# Patient Record
Sex: Female | Born: 1985 | Hispanic: No | Marital: Single | Smoking: Never smoker
Health system: Southern US, Community
[De-identification: ages and names within clinical notes are randomized; demographics above are authoritative.]

## PROBLEM LIST (undated history)

## (undated) DIAGNOSIS — J45909 Unspecified asthma, uncomplicated: Secondary | ICD-10-CM

---
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
## 2012-10-23 ENCOUNTER — Ambulatory Visit: Payer: Self-pay | Admitting: Primary Care

## 2013-03-27 ENCOUNTER — Inpatient Hospital Stay: Payer: Self-pay

## 2013-03-27 LAB — CBC WITH DIFFERENTIAL/PLATELET
Basophil #: 0 10*3/uL (ref 0.0–0.1)
Basophil %: 0.3 %
HCT: 35.8 % (ref 35.0–47.0)
Lymphocyte #: 1.3 10*3/uL (ref 1.0–3.6)
Lymphocyte %: 13.6 %
MCV: 85 fL (ref 80–100)
Monocyte #: 0.6 x10 3/mm (ref 0.2–0.9)
Neutrophil %: 79.6 %
RDW: 15.5 % — ABNORMAL HIGH (ref 11.5–14.5)
WBC: 9.3 10*3/uL (ref 3.6–11.0)

## 2013-03-29 LAB — HEMATOCRIT: HCT: 30.3 % — ABNORMAL LOW (ref 35.0–47.0)

## 2015-03-26 NOTE — Op Note (Signed)
PATIENT NAME:  Morgan Hawkins, Morgan Hawkins MR#:  [id number] DATE OF BIRTH:  1985/12/10  DATE OF PROCEDURE:  03/28/2013  PREOPERATIVE DIAGNOSES:  1. Term gestation.  2. Labor.  3. Cephalopelvic disproportion.   POSTOPERATIVE DIAGNOSES:  1. Term gestation.  2. Labor.  3. Cephalopelvic disproportion.   PROCEDURES: Primary low-transverse cesarean section.   SURGEON: Ricky L. Logan BoresEvans, MD   ASSISTANT: Scrub tech, Morrie SheldonAshley.   ANESTHESIA: Spinal by Dr. Maisie Fushomas.   FINDINGS: No descent over at least 6 hours of labor. Vigorous female infant weighing 3420 grams, 7 pounds 10 ounces. Apgars 9 and 9. Grossly normal uterus, tubes and ovaries.   DRAINS: Foley.   COMPLICATIONS: Midline vertical extension in the lower uterine segment which was recognized and repaired.   ESTIMATED BLOOD LOSS: 900.  INTRAVENOUS FLUIDS: 1000.   SPECIMENS: Infant.   PROCEDURE IN DETAIL: The patient was admitted yesterday morning at 1 cm and thick for postdates induction, progressed up to 3.5 cm by early morning and then approximately 6 this morning. Slow progression up to 9+ cm. She was therefore approximately 4 hours with infant still being asynclitic and out of the pelvis, despite [facility]. At this point, cephalopelvic disproportion was diagnosed and discussed with patient and family. Recommended cesarean section, and they agreed. Consent was signed. She was taken to the operating room, where spinal was placed. She was placed in the supine position, prepped and draped in the usual sterile fashion. Foley had been inserted previously. She had bloody urine for the last several hours, felt most likely due to the head pressing against the bladder. The patient was prepped and draped in the usual sterile fashion. After assuring adequate anesthesia, a #10 blade was used to create a Pfannenstiel incision. Bladder that appeared to have been poorly draining was encountered. Bladder flap was created. The bladder  blade was placed. A transverse incision was made in the uterus. Hand of operator was used to wedge the infant's head out of the pelvis, and with fundal pressure, infant was delivered. The mouth and nose were bulb suctioned and handed off to pediatrics after clamping and cutting cord. Placenta was manually delivered. The uterus was exteriorized and immediately recognized a vertical cervical extension. The apices were grasped with ring forceps, and it was closed with a running interlocking 0 chromic. Uterine incision was then closed left to right with running interlocking 0 chromic. There was a hematoma forming at the left apex, and an O'Leary stitch was placed with 0 chromic. This was observed for 2 solid minutes without evidence of expansion. The uterus was returned to the abdominal cavity and copiously irrigated. Small serosal oozers were made hemostatic. The rectus muscle was hemostatic. The fascia was closed left to right with 0 Vicryl. Subcutaneous was made hemostatic with cautery, and the skin was closed with surgical clips.   The patient tolerated the procedure well. I anticipate a routine postoperative course. We will watch her urine. I expect it to clear over the next 24 to 48 hours, and I am not suspicious of bladder injury.   ____________________________ Reatha Harpsicky L. Logan BoresEvans, MD rle:OSi D: 03/28/2013 16:38:55 ET T: 03/29/2013 05:52:39 ET JOB#: [id number]  cc: Clide Clifficky L. Logan BoresEvans, MD, <Dictator> Augustina MoodICK L Oneda Duffett MD ELECTRONICALLY SIGNED 04/01/2013 9:53

## 2015-04-13 NOTE — H&P (Signed)
L&D Evaluation:  History:  HPI 16XW [age] [facility] with LMP of 06/12/12 & EDd of 03/19/13 & US done on 10/23/12 at 18 1/7 with intact membranes and arrived here for IOL due to post-dates. AFI today is 8.27 and NST reactive. Pt was told she was to be induced today however, it was scheduled originally for 03/05/13 but, [facility] called and moved it up due to her dates being 141 1/7 today. No VB, UC's or decreased FM. Pt does not want to go home today so we will proceed with IOL. Dr. Feliberto GottronSchermerhorn aware and ok with Pitocin IOL and AROM when able.   Presents with IOL for post-dates   Patient's Medical History warts on hand   Patient's Surgical History none   Medications Pre Natal Vitamins   Allergies NKDA   Social History none   Family History Non-Contributory   ROS:  ROS All systems were reviewed.  HEENT, CNS, GI, GU, Respiratory, CV, Renal and Musculoskeletal systems were found to be normal.   Exam:  Vital Signs stable   General no apparent distress   Mental Status clear   Chest clear   Heart normal sinus rhythm, no murmur/gallop/rubs   Abdomen gravid, non-tender   Estimated Fetal Weight Average for gestational age   Fetal Position vtx   Back no CVAT   Reflexes 1+   Pelvic 1-2/40/vtx-2   Mebranes Intact   FHT normal rate with no decels   Ucx absent   Skin dry   Lymph no lymphadenopathy   Plan:  Plan monitor contractions and for cervical change   Electronic Signatures: Sharee PimpleJones, Caron W (CNM)  (Signed 24-Apr-14 10:41)  Authored: L&D Evaluation   Last Updated: 24-Apr-14 10:41 by Sharee PimpleJones, Caron W (CNM)

## 2016-06-03 ENCOUNTER — Emergency Department
Admission: EM | Admit: 2016-06-03 | Discharge: 2016-06-03 | Disposition: A | Discharge status: Home / Self Care | Payer: Medicaid Other | Attending: Emergency Medicine | Admitting: Emergency Medicine

## 2016-06-03 ENCOUNTER — Encounter: Payer: Self-pay | Admitting: Emergency Medicine

## 2016-06-03 DIAGNOSIS — J45909 Unspecified asthma, uncomplicated: Secondary | ICD-10-CM | POA: Diagnosis not present

## 2016-06-03 DIAGNOSIS — H6592 Unspecified nonsuppurative otitis media, left ear: Secondary | ICD-10-CM | POA: Insufficient documentation

## 2016-06-03 DIAGNOSIS — H65112 Acute and subacute allergic otitis media (mucoid) (sanguinous) (serous), left ear: Secondary | ICD-10-CM

## 2016-06-03 DIAGNOSIS — B9689 Other specified bacterial agents as the cause of diseases classified elsewhere: Secondary | ICD-10-CM

## 2016-06-03 DIAGNOSIS — J019 Acute sinusitis, unspecified: Secondary | ICD-10-CM | POA: Insufficient documentation

## 2016-06-03 DIAGNOSIS — R0981 Nasal congestion: Secondary | ICD-10-CM | POA: Diagnosis present

## 2016-06-03 HISTORY — DX: Unspecified asthma, uncomplicated: J45.909

## 2016-06-03 MED ORDER — FLUTICASONE PROPIONATE 50 MCG/ACT NA SUSP: 1.0000 | Freq: Two times a day (BID) | NASAL | Status: DC

## 2016-06-03 MED ORDER — AMOXICILLIN-POT CLAVULANATE 875-125 MG PO TABS: 1.0000 | ORAL_TABLET | Freq: Two times a day (BID) | ORAL | Status: DC

## 2016-06-03 MED ORDER — CETIRIZINE HCL 10 MG PO TABS
10.0000 mg | ORAL_TABLET | Freq: Every day | ORAL | Status: DC
Start: 2016-06-03 — End: 2019-12-29

## 2016-06-03 NOTE — ED Notes (Addendum)
Since Monday night. Dry cough, fever 100.8, Pt reports blood tinged and white mucus from nose and left ear pain. Also reports headache and weakness.

## 2016-06-03 NOTE — ED Provider Notes (Signed)
[facility] Emergency Department Provider Note  ____________________________________________  Time seen: Approximately 4:30 PM  I have reviewed the triage vital signs and the nursing notes.   HISTORY  Chief Complaint URI    HPI Morgan Hawkins is a 30 y.o. female who presents emergency department complaining of a week's history of nasal congestion, scratchy throat, intermittent dry cough. Patient states that initially she felt like she had a "cold. She states that symptoms started to improve and then worsened. Patient reports pressure to the maxillary sinus region. She also reports sharp left sided ear pain. Today patient reports that she has a generalized mild headache, feeling "very tired", and seen some mild blood-tinged mucus while blowing her nose. Patient denies any vision changes, difficulty breathing or swallowing, chest pain, shortness of breath, abdominal pain, nausea or vomiting. Patient reports a low-grade fever up to 100.4F. Patient is been using multiple over-the-counter medications including cough syrup, Mucinex, Tylenol.   Past Medical History  Diagnosis Date  . Asthma     There are no active problems to display for this patient.   Past Surgical History  Procedure Laterality Date  . Cesarean section  2014    Current Outpatient Rx  Name  Route  Sig  Dispense  Refill  . amoxicillin-clavulanate (AUGMENTIN) 875-125 MG tablet   Oral   Take 1 tablet by mouth 2 (two) times daily.   14 tablet   0   . cetirizine (ZYRTEC) 10 MG tablet   Oral   Take 1 tablet (10 mg total) by mouth daily.   30 tablet   0   . fluticasone (FLONASE) 50 MCG/ACT nasal spray   Each Nare   Place 1 spray into both nostrils 2 (two) times daily.   16 g   0     Allergies Review of patient's allergies indicates no known allergies.  No family history on file.  Social History Social History  Substance Use Topics  . Smoking status: Never Smoker   .  Smokeless tobacco: Never Used  . Alcohol Use: No     Review of Systems  Constitutional: No fever/chills Eyes: No visual changes. No discharge ENT: Positive for nasal congestion and sinus pressure. Positive for left-sided ear pain. Positive for blood-tinged nasal mucus. Positive for scratchy throat. Cardiovascular: no chest pain. Respiratory: Positive for intermittent nonproductive cough. No SOB. Gastrointestinal: No abdominal pain.  No nausea, no vomiting.   Musculoskeletal: Negative for musculoskeletal pain. Skin: Negative for rash, abrasions, lacerations, ecchymosis. Neurological: Positive for mild headache but denies focal weakness or numbness. 10-point ROS otherwise negative.  ____________________________________________   PHYSICAL EXAM:  VITAL SIGNS: ED Triage Vitals  Enc Vitals Group     BP 06/03/16 1559 125/79 mmHg     Pulse Rate 06/03/16 1559 107     Resp 06/03/16 1559 20     Temp 06/03/16 1559 99.4 F (37.4 C)     Temp Source 06/03/16 1559 Oral     SpO2 06/03/16 1559 96 %     Weight 06/03/16 1559 160 lb (72.576 kg)     Height 06/03/16 1559 5' (1.524 m)     Head Cir --      Peak Flow --      Pain Score 06/03/16 1602 8     Pain Loc --      Pain Edu? --      Excl. in GC? --      Constitutional: Alert and oriented. Well appearing and in no  acute distress. Eyes: Conjunctivae are normal. PERRL. EOMI. Head: Atraumatic. ENT:      Ears: EACs unremarkable bilaterally. TM on right is unremarkable. TM on left is dusky in appearance, bulging, with air-fluid level.      Nose: Moderate, purulent congestion/rhinnorhea. Minimal scabbing noted over the Kiesselbach plexus on left side. Turbinates are erythematous and edematous. Patient is tender to percussion over maxillary sinuses.      Mouth/Throat: Mucous membranes are moist. Oropharynx is mildly erythematous but nonedematous. Uvula is midline. Neck: No stridor. Neck supple with full range of  motion Hematological/Lymphatic/Immunilogical: Diffuse, mobile, nontender anterior cervical lymphadenopathy. Cardiovascular: Normal rate, regular rhythm. Normal S1 and S2.  Good peripheral circulation. Respiratory: Normal respiratory effort without tachypnea or retractions. Lungs CTAB. Good air entry to the bases with no decreased or absent breath sounds. Musculoskeletal: Full range of motion to all extremities. No gross deformities appreciated. Neurologic:  Normal speech and language. No gross focal neurologic deficits are appreciated.  Skin:  Skin is warm, dry and intact. No rash noted. Psychiatric: Mood and affect are normal. Speech and behavior are normal. Patient exhibits appropriate insight and judgement.   ____________________________________________   LABS (all labs ordered are listed, but only abnormal results are displayed)  Labs Reviewed - No data to display ____________________________________________  EKG   ____________________________________________  RADIOLOGY   No results found.  ____________________________________________    PROCEDURES  Procedure(s) performed:       Medications - No data to display   ____________________________________________   INITIAL IMPRESSION / ASSESSMENT AND PLAN / ED COURSE  Pertinent labs & imaging results that were available during my care of the patient were reviewed by me and considered in my medical decision making (see chart for details).  Patient's diagnosis is consistent with bacterial sinusitis and left-sided otitis media. Symptoms are consistent with onset of viral illness started to improve and then worsened into bacterial illness.. Patient will be discharged home with prescriptions for antibiotics for sinusitis and left-sided otitis media, Flonase, Zyrtec. Patient is to continue using cough syrup and Tylenol or Motrin at home.. Patient is to follow up with primary care provider as needed or otherwise directed.  Patient is given ED precautions to return to the ED for any worsening or new symptoms.     ____________________________________________  FINAL CLINICAL IMPRESSION(S) / ED DIAGNOSES  Final diagnoses:  Acute bacterial sinusitis  Acute mucoid otitis media of left ear      NEW MEDICATIONS STARTED DURING THIS VISIT:  New Prescriptions   AMOXICILLIN-CLAVULANATE (AUGMENTIN) 875-125 MG TABLET    Take 1 tablet by mouth 2 (two) times daily.   CETIRIZINE (ZYRTEC) 10 MG TABLET    Take 1 tablet (10 mg total) by mouth daily.   FLUTICASONE (FLONASE) 50 MCG/ACT NASAL SPRAY    Place 1 spray into both nostrils 2 (two) times daily.        This chart was dictated using voice recognition software/Dragon. Despite best efforts to proofread, errors can occur which can change the meaning. Any change was purely unintentional.    Racheal PatchesJonathan D Henderson Frampton, PA-C 06/03/16 1651  Jennye MoccasinBrian S Quigley, MD 06/03/16 [phone]

## 2016-06-03 NOTE — ED Notes (Signed)
Pt discharged to home.  Family member driving.  Discharge instructions reviewed.  Verbalized understanding.  No questions or concerns at this time.  Teach back verified.  Pt in NAD.  No items left in ED.   

## 2016-06-03 NOTE — Discharge Instructions (Signed)
Otitis Media, Adult  Otitis media is redness, soreness, and inflammation of the middle ear. Otitis media may be caused by allergies or, most commonly, by infection. Often it occurs as a complication of the common cold.  SIGNS AND SYMPTOMS  Symptoms of otitis media may include:   Earache.   Fever.   Ringing in your ear.   Headache.   Leakage of fluid from the ear.  DIAGNOSIS  To diagnose otitis media, your health care provider will examine your ear with an otoscope. This is an instrument that allows your health care provider to see into your ear in order to examine your eardrum. Your health care provider also will ask you questions about your symptoms.  TREATMENT   Typically, otitis media resolves on its own within 3-5 days. Your health care provider may prescribe medicine to ease your symptoms of pain. If otitis media does not resolve within 5 days or is recurrent, your health care provider may prescribe antibiotic medicines if he or she suspects that a bacterial infection is the cause.  HOME CARE INSTRUCTIONS    If you were prescribed an antibiotic medicine, finish it all even if you start to feel better.   Take medicines only as directed by your health care provider.   Keep all follow-up visits as directed by your health care provider.  SEEK MEDICAL CARE IF:   You have otitis media only in one ear, or bleeding from your nose, or both.   You notice a lump on your neck.   You are not getting better in 3-5 days.   You feel worse instead of better.  SEEK IMMEDIATE MEDICAL CARE IF:    You have pain that is not controlled with medicine.   You have swelling, redness, or pain around your ear or stiffness in your neck.   You notice that part of your face is paralyzed.   You notice that the bone behind your ear (mastoid) is tender when you touch it.  MAKE SURE YOU:    Understand these instructions.   Will watch your condition.   Will get help right away if you are not doing well or get worse.     This  information is not intended to replace advice given to you by your health care provider. Make sure you discuss any questions you have with your health care provider.     Document Released: 08/25/2004 Document Revised: 12/11/2014 Document Reviewed: 06/17/2013  Elsevier Interactive Patient Education 2016 [employer]  Sinusitis, Adult  Sinusitis is redness, soreness, and inflammation of the paranasal sinuses. Paranasal sinuses are air pockets within the bones of your face. They are located beneath your eyes, in the middle of your forehead, and above your eyes. In healthy paranasal sinuses, mucus is able to drain out, and air is able to circulate through them by way of your nose. However, when your paranasal sinuses are inflamed, mucus and air can become trapped. This can allow bacteria and other germs to grow and cause infection.  Sinusitis can develop quickly and last only a short time (acute) or continue over a long period (chronic). Sinusitis that lasts for more than 12 weeks is considered chronic.  CAUSES  Causes of sinusitis include:   Allergies.   Structural abnormalities, such as displacement of the cartilage that separates your nostrils (deviated septum), which can decrease the air flow through your nose and sinuses and affect sinus drainage.   Functional abnormalities, such as when the small hairs (cilia) that   line your sinuses and help remove mucus do not work properly or are not present.  SIGNS AND SYMPTOMS  Symptoms of acute and chronic sinusitis are the same. The primary symptoms are pain and pressure around the affected sinuses. Other symptoms include:   Upper toothache.   Earache.   Headache.   Bad breath.   Decreased sense of smell and taste.   A cough, which worsens when you are lying flat.   Fatigue.   Fever.   Thick drainage from your nose, which often is green and may contain pus (purulent).   Swelling and warmth over the affected sinuses.  DIAGNOSIS  Your health care provider will  perform a physical exam. During your exam, your health care provider may perform any of the following to help determine if you have acute sinusitis or chronic sinusitis:   Look in your nose for signs of abnormal growths in your nostrils (nasal polyps).   Tap over the affected sinus to check for signs of infection.   View the inside of your sinuses using an imaging device that has a light attached (endoscope).  If your health care provider suspects that you have chronic sinusitis, one or more of the following tests may be recommended:   Allergy tests.   Nasal culture. A sample of mucus is taken from your nose, sent to a lab, and screened for bacteria.   Nasal cytology. A sample of mucus is taken from your nose and examined by your health care provider to determine if your sinusitis is related to an allergy.  TREATMENT  Most cases of acute sinusitis are related to a viral infection and will resolve on their own within 10 days. Sometimes, medicines are prescribed to help relieve symptoms of both acute and chronic sinusitis. These may include pain medicines, decongestants, nasal steroid sprays, or saline sprays.  However, for sinusitis related to a bacterial infection, your health care provider will prescribe antibiotic medicines. These are medicines that will help kill the bacteria causing the infection.  Rarely, sinusitis is caused by a fungal infection. In these cases, your health care provider will prescribe antifungal medicine.  For some cases of chronic sinusitis, surgery is needed. Generally, these are cases in which sinusitis recurs more than 3 times per year, despite other treatments.  HOME CARE INSTRUCTIONS   Drink plenty of water. Water helps thin the mucus so your sinuses can drain more easily.   Use a humidifier.   Inhale steam 3-4 times a day (for example, sit in the bathroom with the shower running).   Apply a warm, moist washcloth to your face 3-4 times a day, or as directed by your health care  provider.   Use saline nasal sprays to help moisten and clean your sinuses.   Take medicines only as directed by your health care provider.   If you were prescribed either an antibiotic or antifungal medicine, finish it all even if you start to feel better.  SEEK IMMEDIATE MEDICAL CARE IF:   You have increasing pain or severe headaches.   You have nausea, vomiting, or drowsiness.   You have swelling around your face.   You have vision problems.   You have a stiff neck.   You have difficulty breathing.     This information is not intended to replace advice given to you by your health care provider. Make sure you discuss any questions you have with your health care provider.     Document Released: 11/20/2005 Document Revised:   12/11/2014 Document Reviewed: 12/05/2011  Elsevier Interactive Patient Education 2016 Elsevier Inc.

## 2019-12-29 ENCOUNTER — Other Ambulatory Visit: Payer: Self-pay

## 2019-12-29 ENCOUNTER — Emergency Department
Admission: EM | Admit: 2019-12-29 | Discharge: 2019-12-29 | Disposition: A | Discharge status: Home / Self Care | Payer: Self-pay | Attending: Emergency Medicine | Admitting: Emergency Medicine

## 2019-12-29 ENCOUNTER — Encounter: Payer: Self-pay | Admitting: Emergency Medicine

## 2019-12-29 DIAGNOSIS — M25511 Pain in right shoulder: Secondary | ICD-10-CM | POA: Insufficient documentation

## 2019-12-29 DIAGNOSIS — J45909 Unspecified asthma, uncomplicated: Secondary | ICD-10-CM | POA: Insufficient documentation

## 2019-12-29 MED ORDER — CYCLOBENZAPRINE HCL 10 MG PO TABS: 10.0000 mg | ORAL_TABLET | Freq: Three times a day (TID) | ORAL | 0 refills | Status: DC | PRN

## 2019-12-29 MED ORDER — LIDOCAINE 5 % EX PTCH
1.0000 | MEDICATED_PATCH | CUTANEOUS | Status: DC
  Administered 2019-12-29: 14:00:00 1 via TRANSDERMAL
  Filled 2019-12-29: qty 1

## 2019-12-29 MED ORDER — TRAMADOL HCL 50 MG PO TABS: 50.0000 mg | ORAL_TABLET | Freq: Four times a day (QID) | ORAL | 0 refills | Status: DC | PRN

## 2019-12-29 MED ORDER — IBUPROFEN 600 MG PO TABS: 600.0000 mg | ORAL_TABLET | Freq: Three times a day (TID) | ORAL | 0 refills | Status: DC | PRN

## 2019-12-29 NOTE — ED Triage Notes (Signed)
Patient presents to the ED with right shoulder pain that started 2 days ago.  Patient describes pain as burning/sharp sensation.  Patient is holding shoulder and reports pain as severe.  Denies any known injury to her shoulder.

## 2019-12-29 NOTE — ED Provider Notes (Signed)
[facility] Emergency Department Provider Note   ____________________________________________   First MD Initiated Contact with Patient 12/29/19 1245     (approximate)  I have reviewed the triage vital signs and the nursing notes.   HISTORY  Chief Complaint Shoulder Pain    HPI Morgan Hawkins is a 34 y.o. female patient complaining 2 days of right superior/posterior shoulder pain.  Patient denies provocative incident for complaint.  Patient stated waking with pain 2 days ago.  Patient denies loss of sensation or movement.  Patient rates pain as a 10/10.  Patient described pain as "burning/sharp".  No palliative measures for complaint.      Past Medical History:  Diagnosis Date  . Asthma     There are no problems to display for this patient.   Past Surgical History:  Procedure Laterality Date  . CESAREAN SECTION  2014    Prior to Admission medications   Medication Sig Start Date End Date Taking? Authorizing Provider  cyclobenzaprine (FLEXERIL) 10 MG tablet Take 1 tablet (10 mg total) by mouth 3 (three) times daily as needed. 12/29/19   Joni Reining, PA-C  ibuprofen (ADVIL) 600 MG tablet Take 1 tablet (600 mg total) by mouth every 8 (eight) hours as needed. 12/29/19   Joni Reining, PA-C  traMADol (ULTRAM) 50 MG tablet Take 1 tablet (50 mg total) by mouth every 6 (six) hours as needed. 12/29/19 12/28/20  Joni Reining, PA-C    Allergies Patient has no known allergies.  No family history on file.  Social History Social History   Tobacco Use  . Smoking status: Never Smoker  . Smokeless tobacco: Never Used  Substance Use Topics  . Alcohol use: No  . Drug use: No    Review of Systems  Constitutional: No fever/chills Eyes: No visual changes. ENT: No sore throat. Cardiovascular: Denies chest pain. Respiratory: Denies shortness of breath. Gastrointestinal: No abdominal pain.  No nausea, no vomiting.  No diarrhea.  No  constipation. Genitourinary: Negative for dysuria. Musculoskeletal: Right shoulder pain. Skin: Negative for rash. Neurological: Negative for headaches, focal weakness or numbness.   ____________________________________________   PHYSICAL EXAM:  VITAL SIGNS: ED Triage Vitals  Enc Vitals Group     BP 12/29/19 1307 (!) 134/91     Pulse Rate 12/29/19 1307 89     Resp 12/29/19 1307 18     Temp 12/29/19 1307 98.7 F (37.1 C)     Temp Source 12/29/19 1307 Oral     SpO2 12/29/19 1307 99 %     Weight 12/29/19 1243 155 lb (70.3 kg)     Height 12/29/19 1243 5' (1.524 m)     Head Circumference --      Peak Flow --      Pain Score 12/29/19 1242 10     Pain Loc --      Pain Edu? --      Excl. in [place]? --     Constitutional: Alert and oriented.  Moderate distress.   Neck:No cervical spine tenderness to palpation. Cardiovascular: Normal rate, regular rhythm. Grossly normal heart sounds.  Good peripheral circulation. Respiratory: Normal respiratory effort.  No retractions. Lungs CTAB. Musculoskeletal: No obvious deformity to the right shoulder.  Patient has full neck range of motion.  Patient is found on palpation superior posterior aspect of the right shoulder.   Neurologic:  Normal speech and language. No gross focal neurologic deficits are appreciated. No gait instability. Skin:  Skin is  warm, dry and intact. No rash noted. Psychiatric: Mood and affect are normal. Speech and behavior are normal.  ____________________________________________   LABS (all labs ordered are listed, but only abnormal results are displayed)  Labs Reviewed - No data to display ____________________________________________  EKG   ____________________________________________  RADIOLOGY  ED MD interpretation:    Official radiology report(s): No results found.  ____________________________________________   PROCEDURES  Procedure(s) performed (including Critical  Care):  Procedures   ____________________________________________   INITIAL IMPRESSION / ASSESSMENT AND PLAN / ED COURSE  As part of my medical decision making, I reviewed the following data within the [place]     Patient presents with 2 days of right shoulder pain.  Patient physical exam is consistent with shoulder strain.  Patient given discharge care instruction advised take medication as directed.  Patient advised follow-up PCP if no improvement 3 to 5 days.    Morgan Hawkins was evaluated in Emergency Department on 12/29/2019 for the symptoms described in the history of present illness. She was evaluated in the context of the global COVID-19 pandemic, which necessitated consideration that the patient might be at risk for infection with the SARS-CoV-2 virus that causes COVID-19. Institutional protocols and algorithms that pertain to the evaluation of patients at risk for COVID-19 are in a state of rapid change based on information released by regulatory bodies including the CDC and federal and state organizations. These policies and algorithms were followed during the patient's care in the ED.       ____________________________________________   FINAL CLINICAL IMPRESSION(S) / ED DIAGNOSES  Final diagnoses:  Acute pain of right shoulder     ED Discharge Orders         Ordered    cyclobenzaprine (FLEXERIL) 10 MG tablet  3 times daily PRN     12/29/19 1329    ibuprofen (ADVIL) 600 MG tablet  Every 8 hours PRN     12/29/19 1329    traMADol (ULTRAM) 50 MG tablet  Every 6 hours PRN     12/29/19 1329           Note:  This document was prepared using Dragon voice recognition software and may include unintentional dictation errors.    Sable Feil, PA-C 12/29/19 1332    Blake Divine, MD 12/30/19 [phone]

## 2019-12-29 NOTE — ED Notes (Signed)
See triage note  Presents with right shoulder pain  States pain started 2 days ago  unknown injury  No deformity noted  Good pulses

## 2019-12-29 NOTE — ED Notes (Signed)
Pt verbalized understanding of discharge instructions. NAD at this time. 

## 2019-12-29 NOTE — Discharge Instructions (Addendum)
Follow discharge care instruction using heat instead of ice.  Take medication as directed.  Follow-up PCP if no improvement in 3 days.

## 2020-01-05 ENCOUNTER — Emergency Department
Admission: EM | Admit: 2020-01-05 | Discharge: 2020-01-05 | Disposition: A | Discharge status: Home / Self Care | Payer: Medicaid Other | Attending: Emergency Medicine | Admitting: Emergency Medicine

## 2020-01-05 ENCOUNTER — Emergency Department: Payer: Medicaid Other

## 2020-01-05 ENCOUNTER — Encounter: Payer: Self-pay | Admitting: Emergency Medicine

## 2020-01-05 ENCOUNTER — Other Ambulatory Visit: Payer: Self-pay

## 2020-01-05 DIAGNOSIS — Z79899 Other long term (current) drug therapy: Secondary | ICD-10-CM | POA: Diagnosis not present

## 2020-01-05 DIAGNOSIS — M542 Cervicalgia: Secondary | ICD-10-CM | POA: Diagnosis present

## 2020-01-05 DIAGNOSIS — M5412 Radiculopathy, cervical region: Secondary | ICD-10-CM | POA: Diagnosis not present

## 2020-01-05 DIAGNOSIS — M4802 Spinal stenosis, cervical region: Secondary | ICD-10-CM

## 2020-01-05 DIAGNOSIS — J45909 Unspecified asthma, uncomplicated: Secondary | ICD-10-CM | POA: Insufficient documentation

## 2020-01-05 DIAGNOSIS — M9971 Connective tissue and disc stenosis of intervertebral foramina of cervical region: Secondary | ICD-10-CM | POA: Insufficient documentation

## 2020-01-05 MED ORDER — ORPHENADRINE CITRATE 30 MG/ML IJ SOLN
60.0000 mg | Freq: Once | INTRAMUSCULAR | Status: AC
  Administered 2020-01-05: 60 mg via INTRAMUSCULAR
  Filled 2020-01-05: qty 2

## 2020-01-05 MED ORDER — OXYCODONE-ACETAMINOPHEN 5-325 MG PO TABS
1.0000 | ORAL_TABLET | Freq: Once | ORAL | Status: AC
  Administered 2020-01-05: 21:00:00 1 via ORAL
  Filled 2020-01-05: qty 1

## 2020-01-05 MED ORDER — METHOCARBAMOL 500 MG PO TABS: 500.0000 mg | ORAL_TABLET | Freq: Four times a day (QID) | ORAL | 0 refills | Status: DC

## 2020-01-05 MED ORDER — HYDROCODONE-ACETAMINOPHEN 5-325 MG PO TABS: 1.0000 | ORAL_TABLET | ORAL | 0 refills | Status: DC | PRN

## 2020-01-05 MED ORDER — PREDNISONE 10 MG PO TABS: 10.0000 mg | ORAL_TABLET | Freq: Every day | ORAL | 0 refills | Status: DC

## 2020-01-05 MED ORDER — MELOXICAM 15 MG PO TABS: 15.0000 mg | ORAL_TABLET | Freq: Every day | ORAL | 0 refills | Status: DC

## 2020-01-05 MED ORDER — KETOROLAC TROMETHAMINE 30 MG/ML IJ SOLN
30.0000 mg | Freq: Once | INTRAMUSCULAR | Status: AC
  Administered 2020-01-05: 30 mg via INTRAMUSCULAR
  Filled 2020-01-05: qty 1

## 2020-01-05 NOTE — ED Triage Notes (Signed)
Pt to ED from home c/o right arm pain x1 week. States lying down is the worst pain, pain is burning and cramping.  Denies injury.

## 2020-01-05 NOTE — ED Provider Notes (Signed)
[facility] Emergency Department Provider Note  ____________________________________________  Time seen: Approximately 8:37 PM  I have reviewed the triage vital signs and the nursing notes.   HISTORY  Chief Complaint Arm Pain    HPI Morgan Hawkins is a 34 y.o. female who presents the emergency department for evaluation of worsening cervical radiculopathy type symptoms.  Patient states that she has a history of intermittent neck pain with radicular symptoms.  She typically takes over-the-counter medications, and sometimes needs stronger medications from primary care or the emergency department to settle her symptoms.  Patient states that she began with symptoms roughly 10 days ago.  She tried over-the-counter medications with no relief.  She was seen in the emergency department, started on anti-inflammatories, pain medication, muscle relaxers.  She states that the pain is worsening.  No loss of range of motion to the upper extremity.  No recent trauma.  No fevers, chills.  No URI symptoms.  No chest pain or shortness of breath.  Symptoms primarily affect right-sided neck, right arm.  No history of arthritis in self.  Strong family history of rheumatoid arthritis.         Past Medical History:  Diagnosis Date  . Asthma     There are no problems to display for this patient.   Past Surgical History:  Procedure Laterality Date  . CESAREAN SECTION  2014    Prior to Admission medications   Medication Sig Start Date End Date Taking? Authorizing Provider  cyclobenzaprine (FLEXERIL) 10 MG tablet Take 1 tablet (10 mg total) by mouth 3 (three) times daily as needed. 12/29/19   Joni Reining, PA-C  HYDROcodone-acetaminophen (NORCO/VICODIN) 5-325 MG tablet Take 1 tablet by mouth every 4 (four) hours as needed for severe pain. 01/05/20   Captola Teschner, Delorise Royals, PA-C  ibuprofen (ADVIL) 600 MG tablet Take 1 tablet (600 mg total) by mouth every 8 (eight) hours as  needed. 12/29/19   Joni Reining, PA-C  meloxicam (MOBIC) 15 MG tablet Take 1 tablet (15 mg total) by mouth daily. 01/05/20   Rodel Glaspy, Delorise Royals, PA-C  methocarbamol (ROBAXIN) 500 MG tablet Take 1 tablet (500 mg total) by mouth 4 (four) times daily. 01/05/20   Nilesh Stegall, Delorise Royals, PA-C  predniSONE (DELTASONE) 10 MG tablet Take 1 tablet (10 mg total) by mouth daily. 01/05/20   Marcy Bogosian, Delorise Royals, PA-C  traMADol (ULTRAM) 50 MG tablet Take 1 tablet (50 mg total) by mouth every 6 (six) hours as needed. 12/29/19 12/28/20  Joni Reining, PA-C    Allergies Patient has no known allergies.  History reviewed. No pertinent family history.  Social History Social History   Tobacco Use  . Smoking status: Never Smoker  . Smokeless tobacco: Never Used  Substance Use Topics  . Alcohol use: No  . Drug use: No     Review of Systems  Constitutional: No fever/chills Eyes: No visual changes. No discharge ENT: No upper respiratory complaints. Cardiovascular: no chest pain. Respiratory: no cough. No SOB. Gastrointestinal: No abdominal pain.  No nausea, no vomiting. Musculoskeletal: Right-sided neck pain, right-sided arm pain consistent with cervical radiculopathy.  All numbness and tingling to the right hand. Skin: Negative for rash, abrasions, lacerations, ecchymosis. Neurological: Negative for headaches, focal weakness or numbness. 10-point ROS otherwise negative.  ____________________________________________   PHYSICAL EXAM:  VITAL SIGNS: ED Triage Vitals  Enc Vitals Group     BP 01/05/20 2018 (!) 151/89     Pulse Rate 01/05/20 2018 97  Resp 01/05/20 2018 16     Temp 01/05/20 2018 98.2 F (36.8 C)     Temp Source 01/05/20 2018 Oral     SpO2 01/05/20 2018 98 %     Weight 01/05/20 2018 155 lb (70.3 kg)     Height 01/05/20 2018 5' (1.524 m)     Head Circumference --      Peak Flow --      Pain Score 01/05/20 2026 8     Pain Loc --      Pain Edu? --      Excl. in [place]? --       Constitutional: Alert and oriented. Well appearing and in no acute distress. Eyes: Conjunctivae are normal. PERRL. EOMI. Head: Atraumatic. ENT:      Ears:       Nose: No congestion/rhinnorhea.      Mouth/Throat: Mucous membranes are moist.  Neck: No stridor.  No midline cervical spine tenderness to palpation.  Tender to palpation diffusely along right neck, right shoulder.  See below musculoskeletal exam.  Cardiovascular: Normal rate, regular rhythm. Normal S1 and S2.  Good peripheral circulation. Respiratory: Normal respiratory effort without tachypnea or retractions. Lungs CTAB. Good air entry to the bases with no decreased or absent breath sounds. Musculoskeletal: Full range of motion to all extremities. No gross deformities appreciated.  Patient has tenderness to palpation extending from the right paraspinal muscle group into the right trapezius muscle, along the right scapular spine, into the right proximal arm.  Patient is tender to palpation of the right acromioclavicular joint space.  No tenderness to palpation about the right elbow.  Examination of the right elbow, forearm, wrist and hand is unremarkable.  Radial pulse intact bilateral upper extremities.  Sensation intact and equal bilateral upper extremities. Neurologic:  Normal speech and language. No gross focal neurologic deficits are appreciated.  Skin:  Skin is warm, dry and intact. No rash noted. Psychiatric: Mood and affect are normal. Speech and behavior are normal. Patient exhibits appropriate insight and judgement.   ____________________________________________   LABS (all labs ordered are listed, but only abnormal results are displayed)  Labs Reviewed - No data to display ____________________________________________  EKG   ____________________________________________  RADIOLOGY I personally viewed and evaluated these images as part of my medical decision making, as well as reviewing the written report by the  radiologist.  CT Cervical Spine Wo Contrast  Result Date: 01/05/2020 CLINICAL DATA:  Right arm pain for 1 week EXAM: CT CERVICAL SPINE WITHOUT CONTRAST TECHNIQUE: Multidetector CT imaging of the cervical spine was performed without intravenous contrast. Multiplanar CT image reconstructions were also generated. COMPARISON:  None. FINDINGS: Alignment: Mild loss of the normal cervical lordosis is noted which may be related to muscular spasm. Skull base and vertebrae: 7 cervical segments are well visualized. Vertebral body height is well maintained. No acute fracture or acute facet abnormality is noted. The odontoid is within normal limits. Osteophytic changes are noted at C6-7 eccentric to the right causing some neural foraminal narrowing and likely nerve root impingement. Soft tissues and spinal canal: Surrounding soft tissue structures are within normal limits. There is likely some associated disc bulging related to the osteophytic changes at C6-7 on the right. Upper chest: Negative. Other: None. IMPRESSION: Degenerative changes at C6-7 eccentric to the right with neural foraminal narrowing and likely nerve root impingement. This would correspond with the patient's given clinical history. Electronically Signed   By: Inez Catalina M.D.   On: 01/05/2020 21:29  ____________________________________________    PROCEDURES  Procedure(s) performed:    Procedures    Medications  ketorolac (TORADOL) 30 MG/ML injection 30 mg (30 mg Intramuscular Given 01/05/20 2104)  orphenadrine (NORFLEX) injection 60 mg (60 mg Intramuscular Given 01/05/20 2104)  oxyCODONE-acetaminophen (PERCOCET/ROXICET) 5-325 MG per tablet 1 tablet (1 tablet Oral Given 01/05/20 2104)     ____________________________________________   INITIAL IMPRESSION / ASSESSMENT AND PLAN / ED COURSE  Pertinent labs & imaging results that were available during my care of the patient were reviewed by me and considered in my medical decision making  (see chart for details).  Review of the [place] CSRS was performed in accordance of the [facility] prior to dispensing any controlled drugs.           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  Patient presented to the emergency department with worsening radicular symptoms of the right upper extremity.  Patient has had intermittent issues, typically takes over-the-counter medications.  She was recently seen in the emergency department started on anti-inflammatories, muscle relaxer, tramadol.  Patient reports that the pain has only been increasing.  CT scan of the cervical spine revealed foraminal outlet stenosis consistent with patient's reported symptoms.  No concerning findings warranting further investigation with MRI at this time.  I will increase patient's medications to include stronger anti-inflammatory, stronger muscle relaxer, prednisone course, Norco.  I advised the patient to follow-up with neurosurgery at this time..  Patient is given ED precautions to return to the ED for any worsening or new symptoms.     ____________________________________________  FINAL CLINICAL IMPRESSION(S) / ED DIAGNOSES  Final diagnoses:  Neural foraminal stenosis of cervical spine  Cervical radiculopathy      NEW MEDICATIONS STARTED DURING THIS VISIT:  ED Discharge Orders         Ordered    meloxicam (MOBIC) 15 MG tablet  Daily     01/05/20 2157    methocarbamol (ROBAXIN) 500 MG tablet  4 times daily     01/05/20 2157    predniSONE (DELTASONE) 10 MG tablet  Daily    Note to Pharmacy: Take 6 pills x 2 days, 5 pills x 2 days, 4 pills x 2 days, 3 pills x 2 days, 2 pills x 2 days, and 1 pill x 2 days   01/05/20 2157    HYDROcodone-acetaminophen (NORCO/VICODIN) 5-325 MG tablet  Every 4 hours PRN     01/05/20 2157              This chart was dictated using voice recognition software/Dragon. Despite best efforts to  proofread, errors can occur which can change the meaning. Any change was purely unintentional.    Lanette Hampshire 01/05/20 2159    Shaune Pollack, MD 01/10/20 [phone]

## 2020-04-07 ENCOUNTER — Other Ambulatory Visit: Payer: Self-pay

## 2020-04-07 ENCOUNTER — Ambulatory Visit: Payer: Medicaid Other | Attending: Oncology

## 2020-04-08 ENCOUNTER — Other Ambulatory Visit: Payer: Self-pay | Admitting: Primary Care

## 2020-04-08 DIAGNOSIS — N6332 Unspecified lump in axillary tail of the left breast: Secondary | ICD-10-CM

## 2020-04-14 ENCOUNTER — Other Ambulatory Visit: Payer: Self-pay | Admitting: Nurse Practitioner

## 2020-04-14 DIAGNOSIS — M5412 Radiculopathy, cervical region: Secondary | ICD-10-CM

## 2020-04-16 ENCOUNTER — Ambulatory Visit
Admission: RE | Admit: 2020-04-16 | Discharge: 2020-04-16 | Disposition: A | Discharge status: Home / Self Care | Payer: Medicaid Other | Source: Ambulatory Visit | Attending: Primary Care | Admitting: Primary Care

## 2020-04-16 DIAGNOSIS — N6332 Unspecified lump in axillary tail of the left breast: Secondary | ICD-10-CM

## 2020-05-01 ENCOUNTER — Ambulatory Visit: Payer: Medicaid Other

## 2020-05-13 ENCOUNTER — Ambulatory Visit
Admission: RE | Admit: 2020-05-13 | Discharge: 2020-05-13 | Disposition: A | Discharge status: Home / Self Care | Payer: Medicaid Other | Source: Ambulatory Visit | Attending: Nurse Practitioner | Admitting: Nurse Practitioner

## 2020-05-13 ENCOUNTER — Other Ambulatory Visit: Payer: Self-pay

## 2020-05-13 DIAGNOSIS — M5412 Radiculopathy, cervical region: Secondary | ICD-10-CM | POA: Insufficient documentation

## 2020-08-05 ENCOUNTER — Other Ambulatory Visit: Payer: Self-pay | Admitting: Primary Care

## 2020-08-05 DIAGNOSIS — N6332 Unspecified lump in axillary tail of the left breast: Secondary | ICD-10-CM

## 2020-08-05 DIAGNOSIS — Z1231 Encounter for screening mammogram for malignant neoplasm of breast: Secondary | ICD-10-CM

## 2020-08-19 ENCOUNTER — Ambulatory Visit
Admission: RE | Admit: 2020-08-19 | Discharge: 2020-08-19 | Disposition: A | Discharge status: Home / Self Care | Payer: Medicaid Other | Source: Ambulatory Visit | Attending: Primary Care | Admitting: Primary Care

## 2020-08-19 ENCOUNTER — Other Ambulatory Visit: Payer: Self-pay

## 2020-08-19 ENCOUNTER — Other Ambulatory Visit: Payer: Self-pay | Admitting: Primary Care

## 2020-08-19 DIAGNOSIS — N6332 Unspecified lump in axillary tail of the left breast: Secondary | ICD-10-CM

## 2020-08-26 ENCOUNTER — Other Ambulatory Visit: Payer: Self-pay | Admitting: Primary Care

## 2020-08-26 DIAGNOSIS — R928 Other abnormal and inconclusive findings on diagnostic imaging of breast: Secondary | ICD-10-CM

## 2020-08-26 DIAGNOSIS — N6320 Unspecified lump in the left breast, unspecified quadrant: Secondary | ICD-10-CM

## 2020-10-04 ENCOUNTER — Other Ambulatory Visit: Payer: Self-pay | Admitting: Physician Assistant

## 2020-10-04 DIAGNOSIS — R1011 Right upper quadrant pain: Secondary | ICD-10-CM

## 2020-10-10 ENCOUNTER — Inpatient Hospital Stay: Payer: Medicaid Other | Admitting: Anesthesiology

## 2020-10-10 ENCOUNTER — Encounter: Payer: Self-pay | Admitting: Surgery

## 2020-10-10 ENCOUNTER — Other Ambulatory Visit: Payer: Self-pay

## 2020-10-10 ENCOUNTER — Inpatient Hospital Stay: Payer: Medicaid Other

## 2020-10-10 ENCOUNTER — Inpatient Hospital Stay
Admission: EM | Admit: 2020-10-10 | Discharge: 2020-10-11 | DRG: 417 | Disposition: A | Discharge status: Home / Self Care | Payer: Medicaid Other | Attending: Surgery | Admitting: Surgery

## 2020-10-10 ENCOUNTER — Emergency Department: Payer: Medicaid Other

## 2020-10-10 ENCOUNTER — Encounter
Admission: EM | Disposition: A | Discharge status: Home / Self Care | Payer: Self-pay | Source: Home / Self Care | Attending: Surgery

## 2020-10-10 DIAGNOSIS — Z79899 Other long term (current) drug therapy: Secondary | ICD-10-CM | POA: Diagnosis not present

## 2020-10-10 DIAGNOSIS — J45909 Unspecified asthma, uncomplicated: Secondary | ICD-10-CM | POA: Diagnosis present

## 2020-10-10 DIAGNOSIS — Z23 Encounter for immunization: Secondary | ICD-10-CM

## 2020-10-10 DIAGNOSIS — Z791 Long term (current) use of non-steroidal anti-inflammatories (NSAID): Secondary | ICD-10-CM | POA: Diagnosis not present

## 2020-10-10 DIAGNOSIS — Z7952 Long term (current) use of systemic steroids: Secondary | ICD-10-CM | POA: Diagnosis not present

## 2020-10-10 DIAGNOSIS — K810 Acute cholecystitis: Secondary | ICD-10-CM

## 2020-10-10 DIAGNOSIS — Z20822 Contact with and (suspected) exposure to covid-19: Secondary | ICD-10-CM | POA: Diagnosis present

## 2020-10-10 DIAGNOSIS — R1011 Right upper quadrant pain: Secondary | ICD-10-CM

## 2020-10-10 DIAGNOSIS — K8001 Calculus of gallbladder with acute cholecystitis with obstruction: Secondary | ICD-10-CM | POA: Diagnosis present

## 2020-10-10 DIAGNOSIS — K851 Biliary acute pancreatitis without necrosis or infection: Principal | ICD-10-CM

## 2020-10-10 DIAGNOSIS — K8020 Calculus of gallbladder without cholecystitis without obstruction: Secondary | ICD-10-CM

## 2020-10-10 LAB — CBC WITH DIFFERENTIAL/PLATELET
Abs Immature Granulocytes: 0.01 10*3/uL (ref 0.00–0.07)
Basophils Absolute: 0.1 10*3/uL (ref 0.0–0.1)
Basophils Relative: 1 %
Eosinophils Absolute: 0.1 10*3/uL (ref 0.0–0.5)
Eosinophils Relative: 2 %
HCT: 39.5 % (ref 36.0–46.0)
Hemoglobin: 13.1 g/dL (ref 12.0–15.0)
Immature Granulocytes: 0 %
Lymphocytes Relative: 34 %
Lymphs Abs: 2.2 10*3/uL (ref 0.7–4.0)
MCH: 27.2 pg (ref 26.0–34.0)
MCHC: 33.2 g/dL (ref 30.0–36.0)
MCV: 82.1 fL (ref 80.0–100.0)
Monocytes Absolute: 0.5 10*3/uL (ref 0.1–1.0)
Monocytes Relative: 8 %
Neutro Abs: 3.7 10*3/uL (ref 1.7–7.7)
Neutrophils Relative %: 55 %
Platelets: 347 10*3/uL (ref 150–400)
RBC: 4.81 MIL/uL (ref 3.87–5.11)
RDW: 13.9 % (ref 11.5–15.5)
WBC: 6.6 10*3/uL (ref 4.0–10.5)
nRBC: 0 % (ref 0.0–0.2)

## 2020-10-10 LAB — RESPIRATORY PANEL BY RT PCR (FLU A&B, COVID)
Influenza A by PCR: NEGATIVE
Influenza B by PCR: NEGATIVE
SARS Coronavirus 2 by RT PCR: NEGATIVE

## 2020-10-10 LAB — URINALYSIS, COMPLETE (UACMP) WITH MICROSCOPIC
Bacteria, UA: NONE SEEN
Bilirubin Urine: NEGATIVE
Glucose, UA: NEGATIVE mg/dL
Ketones, ur: NEGATIVE mg/dL
Nitrite: NEGATIVE
Protein, ur: NEGATIVE mg/dL
Specific Gravity, Urine: 1.019 (ref 1.005–1.030)
pH: 7 (ref 5.0–8.0)

## 2020-10-10 LAB — COMPREHENSIVE METABOLIC PANEL
ALT: 79 U/L — ABNORMAL HIGH (ref 0–44)
ALT: 70 U/L — ABNORMAL HIGH (ref 0–44)
AST: 60 U/L — ABNORMAL HIGH (ref 15–41)
AST: 39 U/L (ref 15–41)
Albumin: 4.3 g/dL (ref 3.5–5.0)
Albumin: 3.7 g/dL (ref 3.5–5.0)
Alkaline Phosphatase: 91 U/L (ref 38–126)
Alkaline Phosphatase: 78 U/L (ref 38–126)
Anion gap: 8 (ref 5–15)
Anion gap: 7 (ref 5–15)
BUN: 16 mg/dL (ref 6–20)
BUN: 13 mg/dL (ref 6–20)
CO2: 25 mmol/L (ref 22–32)
CO2: 24 mmol/L (ref 22–32)
Calcium: 8.9 mg/dL (ref 8.9–10.3)
Calcium: 8.3 mg/dL — ABNORMAL LOW (ref 8.9–10.3)
Chloride: 106 mmol/L (ref 98–111)
Chloride: 108 mmol/L (ref 98–111)
Creatinine, Ser: 1.1 mg/dL — ABNORMAL HIGH (ref 0.44–1.00)
Creatinine, Ser: 1 mg/dL (ref 0.44–1.00)
GFR, Estimated: >60 mL/min (ref >60)
GFR, Estimated: >60 mL/min (ref >60)
Glucose, Bld: 134 mg/dL — ABNORMAL HIGH (ref 70–99)
Glucose, Bld: 104 mg/dL — ABNORMAL HIGH (ref 70–99)
Potassium: 4.4 mmol/L (ref 3.5–5.1)
Potassium: 3.5 mmol/L (ref 3.5–5.1)
Sodium: 139 mmol/L (ref 135–145)
Sodium: 139 mmol/L (ref 135–145)
Total Bilirubin: 0.8 mg/dL (ref 0.3–1.2)
Total Bilirubin: 0.4 mg/dL (ref 0.3–1.2)
Total Protein: 7.9 g/dL (ref 6.5–8.1)
Total Protein: 6.6 g/dL (ref 6.5–8.1)

## 2020-10-10 LAB — HIV ANTIBODY (ROUTINE TESTING W REFLEX): HIV Screen 4th Generation wRfx: NONREACTIVE

## 2020-10-10 LAB — LIPASE, BLOOD
Lipase: 75 U/L — ABNORMAL HIGH (ref 11–51)
Lipase: 65 U/L — ABNORMAL HIGH (ref 11–51)

## 2020-10-10 LAB — CBC
HCT: 36.4 % (ref 36.0–46.0)
Hemoglobin: 12.2 g/dL (ref 12.0–15.0)
MCH: 27.6 pg (ref 26.0–34.0)
MCHC: 33.5 g/dL (ref 30.0–36.0)
MCV: 82.4 fL (ref 80.0–100.0)
Platelets: 298 10*3/uL (ref 150–400)
RBC: 4.42 MIL/uL (ref 3.87–5.11)
RDW: 14 % (ref 11.5–15.5)
WBC: 6.4 10*3/uL (ref 4.0–10.5)
nRBC: 0 % (ref 0.0–0.2)

## 2020-10-10 LAB — HCG, QUANTITATIVE, PREGNANCY: hCG, Beta Chain, Quant, S: <1 m[IU]/mL (ref <5)

## 2020-10-10 LAB — MAGNESIUM: Magnesium: 2.1 mg/dL (ref 1.7–2.4)

## 2020-10-10 LAB — POC URINE PREG, ED: Preg Test, Ur: NEGATIVE

## 2020-10-10 SURGERY — CHOLECYSTECTOMY, ROBOT-ASSISTED, LAPAROSCOPIC
Anesthesia: Choice

## 2020-10-10 SURGERY — CHOLECYSTECTOMY, ROBOT-ASSISTED, LAPAROSCOPIC
Anesthesia: General | Site: Abdomen

## 2020-10-10 MED ORDER — PROCHLORPERAZINE EDISYLATE 10 MG/2ML IJ SOLN: 5.0000 mg | Freq: Four times a day (QID) | INTRAMUSCULAR | Status: DC | PRN

## 2020-10-10 MED ORDER — BUPIVACAINE-EPINEPHRINE (PF) 0.25% -1:200000 IJ SOLN
INTRAMUSCULAR | Status: DC | PRN
  Administered 2020-10-10: 30 mL via PERINEURAL

## 2020-10-10 MED ORDER — LACTATED RINGERS IV SOLN: INTRAVENOUS | Status: DC | PRN

## 2020-10-10 MED ORDER — SODIUM CHLORIDE 0.9 % IV SOLN
2.0000 g | INTRAVENOUS | Status: DC
  Administered 2020-10-10 – 2020-10-11 (×2): 2 g via INTRAVENOUS
  Filled 2020-10-10: qty 20
  Filled 2020-10-10: qty 2

## 2020-10-10 MED ORDER — ACETAMINOPHEN 10 MG/ML IV SOLN
INTRAVENOUS | Status: AC
  Filled 2020-10-10: qty 100

## 2020-10-10 MED ORDER — MIDAZOLAM HCL 2 MG/2ML IJ SOLN
INTRAMUSCULAR | Status: AC
  Filled 2020-10-10: qty 2

## 2020-10-10 MED ORDER — LIDOCAINE HCL (CARDIAC) PF 100 MG/5ML IV SOSY
PREFILLED_SYRINGE | INTRAVENOUS | Status: DC | PRN
  Administered 2020-10-10: 70 mg via INTRAVENOUS

## 2020-10-10 MED ORDER — PROPOFOL 10 MG/ML IV BOLUS
INTRAVENOUS | Status: DC | PRN
  Administered 2020-10-10: 150 mg via INTRAVENOUS

## 2020-10-10 MED ORDER — ACETAMINOPHEN 500 MG PO TABS
1000.0000 mg | ORAL_TABLET | Freq: Four times a day (QID) | ORAL | Status: DC
  Administered 2020-10-10 – 2020-10-11 (×4): 1000 mg via ORAL
  Filled 2020-10-10 (×4): qty 2

## 2020-10-10 MED ORDER — FENTANYL CITRATE (PF) 100 MCG/2ML IJ SOLN
INTRAMUSCULAR | Status: DC | PRN
  Administered 2020-10-10: 100 ug via INTRAVENOUS

## 2020-10-10 MED ORDER — ROCURONIUM BROMIDE 100 MG/10ML IV SOLN
INTRAVENOUS | Status: DC | PRN
  Administered 2020-10-10: 50 mg via INTRAVENOUS

## 2020-10-10 MED ORDER — PROPOFOL 500 MG/50ML IV EMUL
INTRAVENOUS | Status: AC
  Filled 2020-10-10: qty 50

## 2020-10-10 MED ORDER — KETOROLAC TROMETHAMINE 30 MG/ML IJ SOLN
30.0000 mg | Freq: Four times a day (QID) | INTRAMUSCULAR | Status: DC
  Administered 2020-10-10 – 2020-10-11 (×3): 30 mg via INTRAVENOUS
  Filled 2020-10-10 (×3): qty 1

## 2020-10-10 MED ORDER — HEPARIN SODIUM (PORCINE) 5000 UNIT/ML IJ SOLN
5000.0000 [IU] | Freq: Three times a day (TID) | INTRAMUSCULAR | Status: DC
  Administered 2020-10-10 – 2020-10-11 (×4): 5000 [IU] via SUBCUTANEOUS
  Filled 2020-10-10 (×4): qty 1

## 2020-10-10 MED ORDER — SODIUM CHLORIDE 0.9 % IV BOLUS
1000.0000 mL | Freq: Once | INTRAVENOUS | Status: AC
  Administered 2020-10-10: 1000 mL via INTRAVENOUS

## 2020-10-10 MED ORDER — PROPOFOL 10 MG/ML IV BOLUS
INTRAVENOUS | Status: AC
  Filled 2020-10-10: qty 20

## 2020-10-10 MED ORDER — PIPERACILLIN-TAZOBACTAM 3.375 G IVPB 30 MIN
3.3750 g | Freq: Once | INTRAVENOUS | Status: AC
  Administered 2020-10-10: 3.375 g via INTRAVENOUS

## 2020-10-10 MED ORDER — MORPHINE SULFATE (PF) 2 MG/ML IV SOLN
2.0000 mg | INTRAVENOUS | Status: DC | PRN
  Administered 2020-10-10: 2 mg via INTRAVENOUS
  Filled 2020-10-10: qty 1

## 2020-10-10 MED ORDER — DIPHENHYDRAMINE HCL 50 MG/ML IJ SOLN: 25.0000 mg | Freq: Four times a day (QID) | INTRAMUSCULAR | Status: DC | PRN

## 2020-10-10 MED ORDER — FENTANYL CITRATE (PF) 100 MCG/2ML IJ SOLN
25.0000 ug | INTRAMUSCULAR | Status: DC | PRN
  Administered 2020-10-10 (×3): 50 ug via INTRAVENOUS

## 2020-10-10 MED ORDER — ONDANSETRON HCL 4 MG/2ML IJ SOLN: 4.0000 mg | Freq: Once | INTRAMUSCULAR | Status: DC | PRN

## 2020-10-10 MED ORDER — KETOROLAC TROMETHAMINE 30 MG/ML IJ SOLN
15.0000 mg | Freq: Once | INTRAMUSCULAR | Status: AC
  Administered 2020-10-10: 15 mg via INTRAVENOUS
  Filled 2020-10-10: qty 1

## 2020-10-10 MED ORDER — PROCHLORPERAZINE MALEATE 10 MG PO TABS
10.0000 mg | ORAL_TABLET | Freq: Four times a day (QID) | ORAL | Status: DC | PRN
  Filled 2020-10-10: qty 1

## 2020-10-10 MED ORDER — SODIUM CHLORIDE 0.9 % IV SOLN: INTRAVENOUS | Status: DC

## 2020-10-10 MED ORDER — ONDANSETRON HCL 4 MG/2ML IJ SOLN
4.0000 mg | Freq: Four times a day (QID) | INTRAMUSCULAR | Status: DC | PRN
  Administered 2020-10-10: 4 mg via INTRAVENOUS
  Filled 2020-10-10: qty 2

## 2020-10-10 MED ORDER — HYDROMORPHONE HCL 1 MG/ML IJ SOLN
0.5000 mg | INTRAMUSCULAR | Status: DC | PRN
  Administered 2020-10-10: 0.5 mg via INTRAVENOUS
  Filled 2020-10-10: qty 0.5

## 2020-10-10 MED ORDER — FENTANYL CITRATE (PF) 100 MCG/2ML IJ SOLN
INTRAMUSCULAR | Status: AC
  Filled 2020-10-10: qty 2

## 2020-10-10 MED ORDER — DEXAMETHASONE SODIUM PHOSPHATE 10 MG/ML IJ SOLN
INTRAMUSCULAR | Status: DC | PRN
  Administered 2020-10-10: 5 mg via INTRAVENOUS

## 2020-10-10 MED ORDER — ACETAMINOPHEN 10 MG/ML IV SOLN
INTRAVENOUS | Status: DC | PRN
  Administered 2020-10-10: 1000 mg via INTRAVENOUS

## 2020-10-10 MED ORDER — DEXAMETHASONE SODIUM PHOSPHATE 10 MG/ML IJ SOLN
INTRAMUSCULAR | Status: AC
  Filled 2020-10-10: qty 1

## 2020-10-10 MED ORDER — ONDANSETRON 4 MG PO TBDP: 4.0000 mg | ORAL_TABLET | Freq: Four times a day (QID) | ORAL | Status: DC | PRN

## 2020-10-10 MED ORDER — IOHEXOL 300 MG/ML  SOLN
100.0000 mL | Freq: Once | INTRAMUSCULAR | Status: AC | PRN
  Administered 2020-10-10: 100 mL via INTRAVENOUS
  Filled 2020-10-10: qty 100

## 2020-10-10 MED ORDER — DIPHENHYDRAMINE HCL 25 MG PO CAPS: 25.0000 mg | ORAL_CAPSULE | Freq: Four times a day (QID) | ORAL | Status: DC | PRN

## 2020-10-10 MED ORDER — ONDANSETRON HCL 4 MG/2ML IJ SOLN
4.0000 mg | Freq: Once | INTRAMUSCULAR | Status: AC
  Administered 2020-10-10: 4 mg via INTRAVENOUS
  Filled 2020-10-10: qty 2

## 2020-10-10 MED ORDER — PROPOFOL 10 MG/ML IV BOLUS
INTRAVENOUS | Status: DC | PRN
  Administered 2020-10-10: 175 ug/kg/min via INTRAVENOUS

## 2020-10-10 MED ORDER — ONDANSETRON HCL 4 MG/2ML IJ SOLN
INTRAMUSCULAR | Status: AC
  Filled 2020-10-10: qty 2

## 2020-10-10 MED ORDER — SUGAMMADEX SODIUM 200 MG/2ML IV SOLN
INTRAVENOUS | Status: DC | PRN
  Administered 2020-10-10: 175 mg via INTRAVENOUS

## 2020-10-10 MED ORDER — OXYCODONE HCL 5 MG PO TABS: 5.0000 mg | ORAL_TABLET | ORAL | Status: DC | PRN

## 2020-10-10 MED ORDER — LIDOCAINE HCL (PF) 2 % IJ SOLN
INTRAMUSCULAR | Status: AC
  Filled 2020-10-10: qty 5

## 2020-10-10 MED ORDER — OXYCODONE HCL 5 MG/5ML PO SOLN: 5.0000 mg | Freq: Once | ORAL | Status: DC | PRN

## 2020-10-10 MED ORDER — INDOCYANINE GREEN 25 MG IV SOLR
5.0000 mg | Freq: Once | INTRAVENOUS | Status: DC
  Administered 2020-10-10: 5 mg via INTRAVENOUS
  Filled 2020-10-10: qty 2

## 2020-10-10 MED ORDER — OXYCODONE HCL 5 MG PO TABS: 5.0000 mg | ORAL_TABLET | Freq: Once | ORAL | Status: DC | PRN

## 2020-10-10 MED ORDER — MIDAZOLAM HCL 2 MG/2ML IJ SOLN
INTRAMUSCULAR | Status: DC | PRN
  Administered 2020-10-10: 2 mg via INTRAVENOUS

## 2020-10-10 MED ORDER — KETOROLAC TROMETHAMINE 30 MG/ML IJ SOLN: 30.0000 mg | Freq: Four times a day (QID) | INTRAMUSCULAR | Status: DC | PRN

## 2020-10-10 SURGICAL SUPPLY — 52 items
ADH SKN CLS APL DERMABOND .7 (GAUZE/BANDAGES/DRESSINGS) ×1
APL PRP STRL LF DISP 70% ISPRP (MISCELLANEOUS) ×1
BAG SPEC RTRVL LRG 6X4 10 (ENDOMECHANICALS) ×1
CANISTER SUCT 1200ML W/VALVE (MISCELLANEOUS) ×1 IMPLANT
CANNULA REDUC XI 12-8 STAPL (CANNULA) ×2
CANNULA REDUC XI 12-8MM STAPL (CANNULA) ×1
CANNULA REDUCER 12-8 DVNC XI (CANNULA) ×1 IMPLANT
CHLORAPREP W/TINT 26 (MISCELLANEOUS) ×3 IMPLANT
CLIP VESOLOCK MED LG 6/CT (CLIP) ×3 IMPLANT
COVER WAND RF STERILE (DRAPES) ×3 IMPLANT
DECANTER SPIKE VIAL GLASS SM (MISCELLANEOUS) ×1 IMPLANT
DEFOGGER SCOPE WARMER CLEARIFY (MISCELLANEOUS) ×3 IMPLANT
DERMABOND ADVANCED (GAUZE/BANDAGES/DRESSINGS) ×2
DERMABOND ADVANCED .7 DNX12 (GAUZE/BANDAGES/DRESSINGS) ×1 IMPLANT
DRAPE ARM DVNC X/XI (DISPOSABLE) ×4 IMPLANT
DRAPE COLUMN DVNC XI (DISPOSABLE) ×1 IMPLANT
DRAPE DA VINCI XI ARM (DISPOSABLE) ×12
DRAPE DA VINCI XI COLUMN (DISPOSABLE) ×3
ELECT CAUTERY BLADE 6.4 (BLADE) ×3 IMPLANT
ELECT REM PT RETURN 9FT ADLT (ELECTROSURGICAL) ×3
ELECTRODE REM PT RTRN 9FT ADLT (ELECTROSURGICAL) ×1 IMPLANT
GLOVE BIO SURGEON STRL SZ7 (GLOVE) ×6 IMPLANT
GOWN STRL REUS W/ TWL LRG LVL3 (GOWN DISPOSABLE) ×4 IMPLANT
GOWN STRL REUS W/TWL LRG LVL3 (GOWN DISPOSABLE) ×12
IRRIGATION STRYKERFLOW (MISCELLANEOUS) IMPLANT
IRRIGATOR STRYKERFLOW (MISCELLANEOUS)
IV NS 1000ML (IV SOLUTION)
IV NS 1000ML BAXH (IV SOLUTION) IMPLANT
KIT PINK PAD W/HEAD ARE REST (MISCELLANEOUS) ×3
KIT PINK PAD W/HEAD ARM REST (MISCELLANEOUS) ×1 IMPLANT
LABEL OR SOLS (LABEL) ×1 IMPLANT
MANIFOLD NEPTUNE II (INSTRUMENTS) ×3 IMPLANT
NEEDLE HYPO 22GX1.5 SAFETY (NEEDLE) ×3 IMPLANT
NS IRRIG 500ML POUR BTL (IV SOLUTION) ×3 IMPLANT
OBTURATOR OPTICAL STANDARD 8MM (TROCAR) ×3
OBTURATOR OPTICAL STND 8 DVNC (TROCAR) ×1
OBTURATOR OPTICALSTD 8 DVNC (TROCAR) ×1 IMPLANT
PACK LAP CHOLECYSTECTOMY (MISCELLANEOUS) ×3 IMPLANT
PENCIL ELECTRO HAND CTR (MISCELLANEOUS) ×3 IMPLANT
POUCH SPECIMEN RETRIEVAL 10MM (ENDOMECHANICALS) ×3 IMPLANT
SEAL CANN UNIV 5-8 DVNC XI (MISCELLANEOUS) ×3 IMPLANT
SEAL XI 5MM-8MM UNIVERSAL (MISCELLANEOUS) ×9
SET TUBE SMOKE EVAC HIGH FLOW (TUBING) ×3 IMPLANT
SOLUTION ELECTROLUBE (MISCELLANEOUS) ×1 IMPLANT
SPONGE LAP 18X18 RF (DISPOSABLE) ×3 IMPLANT
SPONGE LAP 4X18 RFD (DISPOSABLE) IMPLANT
STAPLER CANNULA SEAL DVNC XI (STAPLE) ×1 IMPLANT
STAPLER CANNULA SEAL XI (STAPLE) ×3
SUT MNCRL AB 4-0 PS2 18 (SUTURE) ×3 IMPLANT
SUT VICRYL 0 AB UR-6 (SUTURE) ×6 IMPLANT
TAPE TRANSPORE STRL 2 31045 (GAUZE/BANDAGES/DRESSINGS) ×3 IMPLANT
TROCAR BALLN GELPORT 12X130M (ENDOMECHANICALS) ×3 IMPLANT

## 2020-10-10 NOTE — ED Notes (Signed)
This RN to bedside, pt continues to rest in bed with NAD noted at this time. Pt denies further needs at this time. Call bell remains within reach of patient.

## 2020-10-10 NOTE — Plan of Care (Signed)

## 2020-10-10 NOTE — ED Notes (Signed)
Pt up to the restroom, no assistance needed.

## 2020-10-10 NOTE — Progress Notes (Signed)
Consent signed and placed on chart for surgery. No questions comments or concerns.

## 2020-10-10 NOTE — ED Notes (Signed)
This RN to bedside, medications administered per order. Pt tolerated well. Call bell remains within reach of patient at this time. Lights dimmed for patient comfort.

## 2020-10-10 NOTE — Anesthesia Preprocedure Evaluation (Signed)
Anesthesia Evaluation  Patient identified by MRN, date of birth, ID band Patient awake  General Assessment Comment:Acute cholecystitis. Mild nausea but no vomiting. Had [facility] last night without issue.  Reviewed: Allergy & Precautions, NPO status , Patient's Chart, lab work & pertinent test results  History of Anesthesia Complications Negative for: history of anesthetic complications  Airway Mallampati: II  TM Distance: >3 FB Neck ROM: Full    Dental no notable dental hx. (+) Teeth Intact   Pulmonary asthma , neg sleep apnea, neg COPD, Patient abstained from smoking.Not current smoker,  Well controlled, not on inhalers, never hospitalized.   Pulmonary exam normal breath sounds clear to auscultation       Cardiovascular Exercise Tolerance: Good METS(-) hypertension(-) CAD and (-) Past MI negative cardio ROS  (-) dysrhythmias  Rhythm:Regular Rate:Normal - Systolic murmurs    Neuro/Psych negative neurological ROS  negative psych ROS   GI/Hepatic neg GERD  ,(+)     (-) substance abuse  ,   Endo/Other  neg diabetes  Renal/GU negative Renal ROS     Musculoskeletal   Abdominal   Peds  Hematology   Anesthesia Other Findings Past Medical History: No date: Asthma  Reproductive/Obstetrics                             Anesthesia Physical Anesthesia Plan  ASA: II  Anesthesia Plan: General   Post-op Pain Management:    Induction: Intravenous  PONV Risk Score and Plan: 4 or greater and Ondansetron and Dexamethasone  Airway Management Planned: Oral ETT  Additional Equipment: None  Intra-op Plan:   Post-operative Plan: Extubation in OR  Informed Consent: I have reviewed the patients History and Physical, chart, labs and discussed the procedure including the risks, benefits and alternatives for the proposed anesthesia with the patient or authorized representative who has indicated his/her  understanding and acceptance.     Dental advisory given  Plan Discussed with: CRNA and Surgeon  Anesthesia Plan Comments: (Discussed risks of anesthesia with patient, including PONV, sore throat, lip/dental damage. Rare risks discussed as well, such as cardiorespiratory and neurological sequelae. Patient understands.)        Anesthesia Quick Evaluation

## 2020-10-10 NOTE — ED Notes (Signed)
VORB from Dr. Everlene Farrier for 0.5mg  Dilaudid q2 for severe pain, this RN notified him of patient's increased pain after administration of morphine, acknowledged by Dr. Everlene Farrier.

## 2020-10-10 NOTE — H&P (Signed)
Patient ID: Morgan Hawkins, female   DOB: 1986/05/07, 34 y.o.   MRN: [id number]  HPI Morgan Hawkins Morgan Hawkins is a 34 y.o. female presented early this morning with abdominal pain.  Patient reports that the abdominal pain is located in the right upper quadrant is sharp and moderate to severe in nature.  She has had significant relief after narcotics were given here in the emergency room.  She also has decreased appetite and some nausea.  She denies any fevers and chills she also denies any respiratory symptoms.  No evidence of jaundice or biliary obstruction. She did have ultrasound that I have personally reviewed as well as a CT scan that I have also personally reviewed showing evidence of gallstone stuck in the neck of the gallbladder.  Changes might reflect some early acute cholecystitis.  More importantly CT scan does not show any complications related to pancreatitis.  She had lab work showing evidence of increased lipase but this has gone down within within a matter of 6 hours which is very encouraging indicating a very mild case of gallstone pancreatitis.  Her platelets are completely normal hemoglobin is 12.2.,  LFTs are completely normal except very mild elevation of the ALT that is likely reactive from her acute cholecystitis.  She is able to perform more than 6 METS of activity without any shortness of breath or chest pain.  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 Only past surgical history was a C-section and she did well. She saw Dr. Gwen Pounds in June 2021 for some palpitations and an EKG was performed with no evidence of any ischemic changes and cardiologist deemed that this was not coronary in origin.  No further work-up was done at that time HPI  Past Medical History:  Diagnosis Date  . Asthma     Past Surgical History:  Procedure Laterality Date  . CESAREAN SECTION  2014    Fam HX; multiple family members with gallstones requiring  cholecystectomy    Social History Social History   Tobacco Use  . Smoking status: Never Smoker  . Smokeless tobacco: Never Used  Vaping Use  . Vaping Use: Never used  Substance Use Topics  . Alcohol use: No  . Drug use: No    No Known Allergies  Current Facility-Administered Medications  Medication Dose Route Frequency Provider Last Rate Last Admin  . 0.9 %  sodium chloride infusion   Intravenous Continuous Sterling Big F, MD 100 mL/hr at 10/10/20 0514 New Bag at 10/10/20 0514  . acetaminophen (TYLENOL) tablet 1,000 mg  1,000 mg Oral Q6H Keontre Defino F, MD   1,000 mg at 10/10/20 0244  . cefTRIAXone (ROCEPHIN) 2 g in sodium chloride 0.9 % 100 mL IVPB  2 g Intravenous Q24H Raeanna Soberanes F, MD 200 mL/hr at 10/10/20 0815 2 g at 10/10/20 0815  . diphenhydrAMINE (BENADRYL) capsule 25 mg  25 mg Oral Q6H PRN Sequoya Hogsett F, MD       Or  . diphenhydrAMINE (BENADRYL) injection 25 mg  25 mg Intravenous Q6H PRN Cammie Faulstich F, MD      . heparin injection 5,000 Units  5,000 Units Subcutaneous Q8H Leafy Ro, MD   5,000 Units at 10/10/20 0515  . HYDROmorphone (DILAUDID) injection 0.5 mg  0.5 mg Intravenous Q2H PRN Odean Fester F, MD      . indocyanine green (IC-GREEN) injection 5 mg  5 mg Intravenous Once Harvin Konicek, Merri Ray, MD      .  ketorolac (TORADOL) 30 MG/ML injection 30 mg  30 mg Intravenous Q6H PRN Farheen Pfahler F, MD      . ondansetron (ZOFRAN-ODT) disintegrating tablet 4 mg  4 mg Oral Q6H PRN Yamira Papa F, MD       Or  . ondansetron (ZOFRAN) injection 4 mg  4 mg Intravenous Q6H PRN Casee Knepp F, MD      . prochlorperazine (COMPAZINE) tablet 10 mg  10 mg Oral Q6H PRN Li Fragoso F, MD       Or  . prochlorperazine (COMPAZINE) injection 5-10 mg  5-10 mg Intravenous Q6H PRN Blayde Bacigalupi, Merri Ray, MD       Current Outpatient Medications  Medication Sig Dispense Refill  . UNKNOWN TO PATIENT Take 1 tablet by mouth daily. Oral contraceptive, unsure of name    . cyclobenzaprine (FLEXERIL) 10  MG tablet Take 1 tablet (10 mg total) by mouth 3 (three) times daily as needed. (Patient not taking: Reported on 10/10/2020) 15 tablet 0  . HYDROcodone-acetaminophen (NORCO/VICODIN) 5-325 MG tablet Take 1 tablet by mouth every 4 (four) hours as needed for severe pain. (Patient not taking: Reported on 10/10/2020) 15 tablet 0  . ibuprofen (ADVIL) 600 MG tablet Take 1 tablet (600 mg total) by mouth every 8 (eight) hours as needed. (Patient not taking: Reported on 10/10/2020) 15 tablet 0  . meloxicam (MOBIC) 15 MG tablet Take 1 tablet (15 mg total) by mouth daily. (Patient not taking: Reported on 10/10/2020) 30 tablet 0  . methocarbamol (ROBAXIN) 500 MG tablet Take 1 tablet (500 mg total) by mouth 4 (four) times daily. (Patient not taking: Reported on 10/10/2020) 16 tablet 0  . predniSONE (DELTASONE) 10 MG tablet Take 1 tablet (10 mg total) by mouth daily. (Patient not taking: Reported on 10/10/2020) 42 tablet 0  . traMADol (ULTRAM) 50 MG tablet Take 1 tablet (50 mg total) by mouth every 6 (six) hours as needed. (Patient not taking: Reported on 10/10/2020) 20 tablet 0     Review of Systems Full ROS  was asked and was negative except for the information on the HPI  Physical Exam Blood pressure 120/89, pulse 75, temperature 98 F (36.7 C), temperature source Oral, resp. rate 17, height 5' (1.524 m), weight 72.6 kg, last menstrual period 09/21/2020, SpO2 98 %. CONSTITUTIONAL: NAD EYES: Pupils are equal, round,  Sclera are non-icteric. EARS, NOSE, MOUTH AND THROAT: Wearing a mask,. Hearing is intact to voice. LYMPH NODES:  Lymph nodes in the neck are normal. RESPIRATORY:  Lungs are clear. There is normal respiratory effort, with equal breath sounds bilaterally, and without pathologic use of accessory muscles. CARDIOVASCULAR: Heart is regular without murmurs, gallops, or rubs. GI: The abdomen is soft,TTP RUQ with + murphy sign. There are no palpable masses. There is no hepatosplenomegaly. There are normal  bowel sounds in all quadrants. GU: Rectal deferred.   MUSCULOSKELETAL: Normal muscle strength and tone. No cyanosis or edema.   SKIN: Turgor is good and there are no pathologic skin lesions or ulcers. NEUROLOGIC: Motor and sensation is grossly normal. Cranial nerves are grossly intact. PSYCH:  Oriented to person, place and time. Affect is normal.  Data Reviewed   I have personally reviewed the patient's imaging, laboratory findings and medical records.    Assessment/Plan 34 year old female with acute cholecystitis and very mild gallstone pancreatitis.  CT scan does not show any evidence of pancreatitis or complications related to pancreatitis.  Current most recent literature supports early cholecystectomy in patients with mild gallstone pancreatitis  to the reviews length of stay.  This was published in the Journal of [employer] this month. She has clinical symptoms consistent with acute cholecystitis and very mild gallstone pancreatitis.  I do think that early cholecystectomy is indicated.  I do also think that she will be a good candidate for robotic cholecystectomy. Specifically she is a TEFL teacher [employer] and although I do not anticipate any blood transfusions she is also adamant about not getting any blood products even if her life would be a risk.  Discussed with patient in detail about the procedure in detail and all other alternative to blood products.  I discussed the procedure in detail.  The patient was given Agricultural engineer.  We discussed the risks and benefits of a laparoscopic cholecystectomy and possible cholangiogram including, but not limited to bleeding, infection, injury to surrounding structures such as the intestine or liver, bile leak, retained gallstones, need to convert to an open procedure, prolonged diarrhea, blood clots such as  DVT, common bile duct injury, anesthesia risks, and possible need for additional procedures.  The likelihood of improvement in  symptoms and return to the patient's normal status is good. We discussed the typical post-operative recovery course.   Sterling Big, MD FACS General Surgeon 10/10/2020, 10:44 AM

## 2020-10-10 NOTE — ED Triage Notes (Signed)
Patient reports having pain from right mid back pain that radiates around to right upper abdomen.  Reports this is 3rd episode.

## 2020-10-10 NOTE — Anesthesia Postprocedure Evaluation (Signed)
Anesthesia Post Note  Patient: Morgan Hawkins  Procedure(s) Performed: XI ROBOTIC ASSISTED LAPAROSCOPIC CHOLECYSTECTOMY (N/A Abdomen)  Patient location during evaluation: PACU Anesthesia Type: General Level of consciousness: awake and alert Pain management: pain level controlled Vital Signs Assessment: post-procedure vital signs reviewed and stable Respiratory status: spontaneous breathing, nonlabored ventilation, respiratory function stable and patient connected to nasal cannula oxygen Cardiovascular status: blood pressure returned to baseline and stable Postop Assessment: no apparent nausea or vomiting Anesthetic complications: no   No complications documented.   Last Vitals:  Vitals:   10/10/20 1418 10/10/20 1428  BP: 118/72   Pulse: 91 78  Resp: (!) 22 12  Temp: 36.5 C   SpO2: 96% 97%    Last Pain:  Vitals:   10/10/20 1428  TempSrc:   PainSc: 5                  Corinda Gubler

## 2020-10-10 NOTE — Anesthesia Procedure Notes (Signed)
Procedure Name: Intubation Date/Time: 10/10/2020 1:05 PM Performed by: Clyde Lundborg, CRNA Pre-anesthesia Checklist: Patient identified, Emergency Drugs available, Suction available and Patient being monitored Patient Re-evaluated:Patient Re-evaluated prior to induction Oxygen Delivery Method: Circle system utilized Preoxygenation: Pre-oxygenation with 100% oxygen Induction Type: IV induction Ventilation: Mask ventilation without difficulty Laryngoscope Size: McGraph and 3 Grade View: Grade I Tube type: Oral Tube size: 7.0 mm Number of attempts: 1 Airway Equipment and Method: Video-laryngoscopy Placement Confirmation: ETT inserted through vocal cords under direct vision,  positive ETCO2,  breath sounds checked- equal and bilateral and CO2 detector Secured at: 21 cm Tube secured with: Tape Dental Injury: Teeth and Oropharynx as per pre-operative assessment

## 2020-10-10 NOTE — ED Notes (Signed)
This RN to bedside, introduced self to patient. Pt resting in bed with lights dimmed, TV on. Pt A&O x4. Call bell within reach. Pt states pain 3/10 at this time. Pt states understanding to use call bell should further needs arise.

## 2020-10-10 NOTE — Transfer of Care (Signed)
Immediate Anesthesia Transfer of Care Note  Patient: Morgan Hawkins  Procedure(s) Performed: XI ROBOTIC ASSISTED LAPAROSCOPIC CHOLECYSTECTOMY (N/A Abdomen)  Patient Location: PACU  Anesthesia Type:General  Level of Consciousness: awake, alert  and oriented  Airway & Oxygen Therapy: Patient Spontanous Breathing  Post-op Assessment: Report given to RN and Post -op Vital signs reviewed and stable  Post vital signs: Reviewed and stable  Last Vitals:  Vitals Value Taken Time  BP    Temp    Pulse    Resp    SpO2      Last Pain:  Vitals:   10/10/20 1029  TempSrc: Oral  PainSc:          Complications: No complications documented.

## 2020-10-10 NOTE — Op Note (Signed)
Robotic assisted laparoscopic Cholecystectomy  Pre-operative Diagnosis: acute cholecystitis with mild gallstone pancreatitis  Post-operative Diagnosis: same  Procedure:  Robotic assisted laparoscopic Cholecystectomy  Surgeon: Sterling Big, MD FACS  Anesthesia: Gen. with endotracheal tube  Findings: Acute Cholecystitis with cholelithiasis  Estimated Blood Loss: 5cc       Specimens: Gallbladder           Complications: none   Procedure Details  The patient was seen again in the Holding Room. The benefits, complications, treatment options, and expected outcomes were discussed with the patient. The risks of bleeding, infection, recurrence of symptoms, failure to resolve symptoms, bile duct damage, bile duct leak, retained common bile duct stone, bowel injury, any of which could require further surgery and/or ERCP, stent, or papillotomy were reviewed with the patient. The likelihood of improving the patient's symptoms with return to their baseline status is good.  The patient and/or family concurred with the proposed plan, giving informed consent.  The patient was taken to Operating Room, identified  and the procedure verified as Laparoscopic Cholecystectomy.  A Time Out was held and the above information confirmed.  Prior to the induction of general anesthesia, antibiotic prophylaxis was administered. VTE prophylaxis was in place. General endotracheal anesthesia was then administered and tolerated well. After the induction, the abdomen was prepped with Chloraprep and draped in the sterile fashion. The patient was positioned in the supine position.  Cut down technique was used to enter the abdominal cavity and a Hasson trochar was placed after two vicryl stitches were anchored to the fascia. Pneumoperitoneum was then created with CO2 and tolerated well without any adverse changes in the patient's vital signs.  Three 8-mm ports were placed under direct vision. All skin incisions  were  infiltrated with a local anesthetic agent before making the incision and placing the trocars.   The patient was positioned  in reverse Trendelenburg, robot was brought to the surgical field and docked in the standard fashion.  We made sure all the instrumentation was kept indirect view at all times and that there were no collision between the arms. I scrubbed out and went to the console.  The gallbladder was identified, the fundus grasped and retracted cephalad. Adhesions were lysed bluntly. The infundibulum was grasped and retracted laterally, exposing the peritoneum overlying the triangle of Calot. This was then divided and exposed in a blunt fashion. An extended critical view of the cystic duct and cystic artery was obtained.  The cystic duct was clearly identified and bluntly dissected.   Artery and duct were double clipped and divided. Using ICG cholangiography we visualize the cystic duct and observed no evidence of bile injuries. The gallbladder was taken from the gallbladder fossa in a retrograde fashion with the electrocautery.  Hemostasis was achieved with the electrocautery. nspection of the right upper quadrant was performed. No bleeding, bile duct injury or leak, or bowel injury was noted. Robotic instruments and robotic arms were undocked in the standard fashion.  I scrubbed back in.  The gallbladder was removed and placed in an Endocatch bag.   Pneumoperitoneum was released.  The periumbilical port site was closed with interrumpted 0 Vicryl sutures. 4-0 subcuticular Monocryl was used to close the skin. Dermabond was  applied.  The patient was then extubated and brought to the recovery room in stable condition. Sponge, lap, and needle counts were correct at closure and at the conclusion of the case.  Caroleen Hamman, MD, FACS

## 2020-10-10 NOTE — ED Notes (Signed)
Pt c/o increased pain after morphine administration. Pt states pain now 9/10.

## 2020-10-10 NOTE — ED Notes (Signed)
Dr. Everlene Farrier made aware of patient c/o increased pain after morphine administration via secure chat. Awaiting orders.

## 2020-10-10 NOTE — ED Notes (Signed)
US in progress

## 2020-10-10 NOTE — ED Provider Notes (Signed)
[facility] Emergency Department Provider Note  ____________________________________________  Time seen: Approximately 12:53 AM  I have reviewed the triage vital signs and the nursing notes.   HISTORY  Chief Complaint Back Pain and Abdominal Pain   HPI Morgan Hawkins is a 34 y.o. female with a history of asthma who presents for evaluation of abdominal pain.  Patient reports that this is the third episode that she has had since September.  She has an outpatient ultrasound of her gallbladder scheduled but has not had that done yet.  This episode started around 10 PM.  The pain is sharp and crampy located in the middle of her back radiating across the right flank into the right upper quadrant.  She has had nausea associated with it.   She reports that the first episode went away with Excedrin.  The second 1 she had pain for most of the night which subsided in the morning.  She denies dysuria, hematuria, vomiting, fever, diarrhea or constipation, chest pain or shortness of breath.  She does take birth control pill but denies that the pain is pleuritic in nature, no chest pain or shortness of breath.  No personal or family history of blood clots, recent travel immobilization, leg pain or swelling, hemoptysis or exogenous hormones.  LMP 3 weeks ago.  Patient has had a C-section but no other abdominal surgeries.  Past Medical History:  Diagnosis Date  . Asthma     There are no problems to display for this patient.   Past Surgical History:  Procedure Laterality Date  . CESAREAN SECTION  2014    Prior to Admission medications   Medication Sig Start Date End Date Taking? Authorizing Provider  cyclobenzaprine (FLEXERIL) 10 MG tablet Take 1 tablet (10 mg total) by mouth 3 (three) times daily as needed. 12/29/19   Joni Reining, PA-C  HYDROcodone-acetaminophen (NORCO/VICODIN) 5-325 MG tablet Take 1 tablet by mouth every 4 (four) hours as needed for severe pain.  01/05/20   Cuthriell, Delorise Royals, PA-C  ibuprofen (ADVIL) 600 MG tablet Take 1 tablet (600 mg total) by mouth every 8 (eight) hours as needed. 12/29/19   Joni Reining, PA-C  meloxicam (MOBIC) 15 MG tablet Take 1 tablet (15 mg total) by mouth daily. 01/05/20   Cuthriell, Delorise Royals, PA-C  methocarbamol (ROBAXIN) 500 MG tablet Take 1 tablet (500 mg total) by mouth 4 (four) times daily. 01/05/20   Cuthriell, Delorise Royals, PA-C  predniSONE (DELTASONE) 10 MG tablet Take 1 tablet (10 mg total) by mouth daily. 01/05/20   Cuthriell, Delorise Royals, PA-C  traMADol (ULTRAM) 50 MG tablet Take 1 tablet (50 mg total) by mouth every 6 (six) hours as needed. 12/29/19 12/28/20  Joni Reining, PA-C    Allergies Patient has no known allergies.  No family history on file.  Social History Social History   Tobacco Use  . Smoking status: Never Smoker  . Smokeless tobacco: Never Used  Substance Use Topics  . Alcohol use: No  . Drug use: No    Review of Systems  Constitutional: Negative for fever. Eyes: Negative for visual changes. ENT: Negative for sore throat. Neck: No neck pain  Cardiovascular: Negative for chest pain. Respiratory: Negative for shortness of breath. Gastrointestinal: + right upper abdominal pain and nausea. no vomiting or diarrhea. Genitourinary: Negative for dysuria. Musculoskeletal: Negative for back pain. Skin: Negative for rash. Neurological: Negative for headaches, weakness or numbness. Psych: No SI or HI  ____________________________________________  PHYSICAL EXAM:  VITAL SIGNS: ED Triage Vitals  Enc Vitals Group     BP 10/10/20 0032 128/88     Pulse Rate 10/10/20 0032 87     Resp 10/10/20 0032 18     Temp 10/10/20 0032 (!) 97.5 F (36.4 C)     Temp Source 10/10/20 0032 Oral     SpO2 10/10/20 0032 100 %     Weight 10/10/20 0026 160 lb (72.6 kg)     Height 10/10/20 0026 5' (1.524 m)     Head Circumference --      Peak Flow --      Pain Score 10/10/20 0025 7     Pain  Loc --      Pain Edu? --      Excl. in GC? --     Constitutional: Alert and oriented. Well appearing and in no apparent distress. HEENT:      Head: Normocephalic and atraumatic.         Eyes: Conjunctivae are normal. Sclera is non-icteric.       Mouth/Throat: Mucous membranes are moist.       Neck: Supple with no signs of meningismus. Cardiovascular: Regular rate and rhythm. No murmurs, gallops, or rubs. 2+ symmetrical distal pulses are present in all extremities. No JVD. Respiratory: Normal respiratory effort. Lungs are clear to auscultation bilaterally.  Gastrointestinal: Soft, tender to palpation in the epigastric and right upper quadrant with negative Murphy sign, and non distended with positive bowel sounds. No rebound or guarding. Genitourinary: No CVA tenderness. Musculoskeletal: No edema, cyanosis, or erythema of extremities. Neurologic: Normal speech and language. Face is symmetric. Moving all extremities. No gross focal neurologic deficits are appreciated. Skin: Skin is warm, dry and intact. No rash noted. Psychiatric: Mood and affect are normal. Speech and behavior are normal.  ____________________________________________   LABS (all labs ordered are listed, but only abnormal results are displayed)  Labs Reviewed  LIPASE, BLOOD - Abnormal; Notable for the following components:      Result Value   Lipase 75 (*)    All other components within normal limits  COMPREHENSIVE METABOLIC PANEL - Abnormal; Notable for the following components:   Glucose, Bld 134 (*)    Creatinine, Ser 1.10 (*)    AST 60 (*)    ALT 79 (*)    All other components within normal limits  URINALYSIS, COMPLETE (UACMP) WITH MICROSCOPIC - Abnormal; Notable for the following components:   Color, Urine YELLOW (*)    APPearance HAZY (*)    Hgb urine dipstick MODERATE (*)    Leukocytes,Ua SMALL (*)    All other components within normal limits  RESPIRATORY PANEL BY RT PCR (FLU A&B, COVID)  HCG,  QUANTITATIVE, PREGNANCY  CBC WITH DIFFERENTIAL/PLATELET  CBC WITH DIFFERENTIAL/PLATELET  POC URINE PREG, ED   ____________________________________________  EKG  none  ____________________________________________  RADIOLOGY  I have personally reviewed the images performed during this visit and I agree with the Radiologist's read.   Interpretation by Radiologist:  US ABDOMEN LIMITED RUQ (LIVER/GB)  Result Date: 10/10/2020 CLINICAL DATA:  Epigastric pain EXAM: ULTRASOUND ABDOMEN LIMITED RIGHT UPPER QUADRANT COMPARISON:  None. FINDINGS: Gallbladder: There are gallstones without evidence for gallbladder wall thickening. The sonographic Eulah Pont sign is negative. The gallbladder is distended. There appears to be a gallstone wedged at the gallbladder neck. Common bile duct: Diameter: 4 mm Liver: Diffuse increased echogenicity with slightly heterogeneous liver. Appearance typically secondary to fatty infiltration. Fibrosis secondary consideration. No secondary findings of  cirrhosis noted. No focal hepatic lesion or intrahepatic biliary duct dilatation. Portal vein is patent on color Doppler imaging with normal direction of blood flow towards the liver. Other: None. IMPRESSION: Cholelithiasis without definite sonographic evidence for acute cholecystitis. A gallstone is noted to be wedged at the gallbladder neck. Electronically Signed   By: Katherine Mantle M.D.   On: 10/10/2020 01:41      ____________________________________________   PROCEDURES  Procedure(s) performed: None Procedures Critical Care performed:  None ____________________________________________   INITIAL IMPRESSION / ASSESSMENT AND PLAN / ED COURSE   34 y.o. female with a history of asthma who presents for evaluation of RUQ abdominal pain and nausea.  Patient is well-appearing in no distress with normal vital signs, abdomen is soft with tenderness to palpation the epigastric and right upper quadrant with negative Murphy  sign.  Differential diagnoses including gallbladder pathology versus kidney stone versus pyelonephritis versus UTI versus pancreatitis versus hepatitis versus peptic ulcer disease versus GERD versus gastroenteritis versus appendicitis versus ectopic pregnancy.  Low suspicion for PE with no tachypnea, tachycardia, hypoxia, no chest pain, no shortness of breath.  Plan for CBC, CMP, lipase, hCG, urinalysis, and a right upper quadrant ultrasound.  We will treat the pain with IV Toradol and Zofran.  History gathered from patient and her husband is at bedside.  Plan discussed with both of them.  Old medical records reviewed showing a scheduled ultrasound of the abdomen for 3 days in the future.  _________________________ 2:05 AM on 10/10/2020 ----------------------------------------- Ultrasound visualized by me showing no signs of cholecystitis but patient does have cholelithiasis with a pretty large stone on the neck of the gallbladder.  The read is confirmed by radiology.  Labs showing mildly elevated lipase and LFTs.  Patient still tender to palpation on the right upper quadrant.  Discussed with Dr. Everlene Farrier from surgery who recommended admission for lap chole.  This recommendation was discussed with patient and her husband were both in agreement.  We will give IV Zosyn and swab her for Covid.  Patient is n.p.o.  Will give IV fluids.      _____________________________________________ Please note:  Patient was evaluated in Emergency Department today for the symptoms described in the history of present illness. Patient was evaluated in the context of the global COVID-19 pandemic, which necessitated consideration that the patient might be at risk for infection with the SARS-CoV-2 virus that causes COVID-19. Institutional protocols and algorithms that pertain to the evaluation of patients at risk for COVID-19 are in a state of rapid change based on information released by regulatory bodies including the CDC  and federal and state organizations. These policies and algorithms were followed during the patient's care in the ED.  Some ED evaluations and interventions may be delayed as a result of limited staffing during the pandemic.   [place] Controlled Substance Database was reviewed by me. ____________________________________________   FINAL CLINICAL IMPRESSION(S) / ED DIAGNOSES   Final diagnoses:  RUQ abdominal pain  Gallstone pancreatitis  Symptomatic cholelithiasis      NEW MEDICATIONS STARTED DURING THIS VISIT:  ED Discharge Orders    None       Note:  This document was prepared using Dragon voice recognition software and may include unintentional dictation errors.    Nita Sickle, MD 10/10/20 [phone]

## 2020-10-10 NOTE — ED Notes (Signed)
Pt assisted to the bathroom by this RN. Pt states pain decreased from previous morphine administration. Call bell remains within reach of patient at this time.

## 2020-10-11 LAB — COMPREHENSIVE METABOLIC PANEL
ALT: 71 U/L — ABNORMAL HIGH (ref 0–44)
AST: 36 U/L (ref 15–41)
Albumin: 3.5 g/dL (ref 3.5–5.0)
Alkaline Phosphatase: 75 U/L (ref 38–126)
Anion gap: 8 (ref 5–15)
BUN: 6 mg/dL (ref 6–20)
CO2: 24 mmol/L (ref 22–32)
Calcium: 9.2 mg/dL (ref 8.9–10.3)
Chloride: 106 mmol/L (ref 98–111)
Creatinine, Ser: 0.81 mg/dL (ref 0.44–1.00)
GFR, Estimated: >60 mL/min (ref >60)
Glucose, Bld: 106 mg/dL — ABNORMAL HIGH (ref 70–99)
Potassium: 4.1 mmol/L (ref 3.5–5.1)
Sodium: 138 mmol/L (ref 135–145)
Total Bilirubin: 0.4 mg/dL (ref 0.3–1.2)
Total Protein: 6.8 g/dL (ref 6.5–8.1)

## 2020-10-11 LAB — LIPASE, BLOOD: Lipase: 42 U/L (ref 11–51)

## 2020-10-11 MED ORDER — IBUPROFEN 800 MG PO TABS: 800.0000 mg | ORAL_TABLET | Freq: Three times a day (TID) | ORAL | 0 refills | Status: AC | PRN

## 2020-10-11 MED ORDER — SODIUM CHLORIDE 0.9 % IV SOLN
INTRAVENOUS | Status: DC | PRN
  Administered 2020-10-11: 250 mL via INTRAVENOUS

## 2020-10-11 MED ORDER — INFLUENZA VAC SPLIT QUAD 0.5 ML IM SUSY
0.5000 mL | PREFILLED_SYRINGE | INTRAMUSCULAR | Status: AC
  Administered 2020-10-11: 0.5 mL via INTRAMUSCULAR
  Filled 2020-10-11: qty 0.5

## 2020-10-11 MED ORDER — OXYCODONE HCL 5 MG PO TABS: 5.0000 mg | ORAL_TABLET | ORAL | 0 refills | Status: AC | PRN

## 2020-10-11 NOTE — Discharge Instructions (Signed)
In addition to included general post-operative instructions for laparoscopic cholecystectomy,  Diet: Resume home diet. Recommend avoiding or limiting intake of fatty / greasy foods for the first few days/week after surgery. If you eat these, you may (or may not) notice diarrhea. This is expected while your body adjusts to not having a gallbladder and resolves with time.   Activity: No heavy lifting >20 pounds (children, pets, laundry, garbage) for 4 weeks, but light activity and walking are encouraged. Do not drive or drink alcohol if taking narcotic pain medications or having pain that might distract from driving.  Wound care: 2 days after surgery (11/09), you may shower/get incision wet with soapy water and pat dry (do not rub incisions), but no baths or submerging incision underwater until follow-up.   Medications: Resume all home medications . For mild to moderate pain: acetaminophen (Tylenol) or ibuprofen/naproxen (if no kidney disease). Combining Tylenol with alcohol can substantially increase your risk of causing liver disease. Narcotic pain medications, if prescribed, can be used for severe pain, though may cause nausea, constipation, and drowsiness. Do not combine Tylenol and Percocet (or similar) within a 6 hour period as Percocet (and similar) contain(s) Tylenol. If you do not need the narcotic pain medication, you do not need to fill the prescription.  Call office [phone] / [phone]) at any time if any questions, worsening pain, fevers/chills, bleeding, drainage from incision site, or other concerns.

## 2020-10-11 NOTE — Progress Notes (Signed)
Mobility Specialist - Progress Note   10/11/20 1100  Mobility  Activity Ambulated in room  Level of Assistance Independent  Assistive Device None  Distance Ambulated (ft) 100 ft  Mobility Response Tolerated well  Mobility performed by Mobility specialist  $Mobility charge 1 Mobility    Pre-mobility: 93 HR, 99% SpO2 Post-mobility: 100 HR, 99% SpO2   Pt was sitting EOB upon arrival. Pt agreed to session. Pt c/o pain "2/10" in abdomen, but denied fatigue or nausea. Pt ambulated 100' in room with no LOB. Pt ambulating with slow and steady gait to manage pain. Pt denies SOB or weakness. Overall, pt tolerated session well. Pt was left EOB with all needs in reach.    Filiberto Pinks Mobility Specialist 10/11/20, 11:05 AM

## 2020-10-11 NOTE — Discharge Summary (Signed)
[facility] SURGICAL DISCHARGE SUMMARY   Patient ID: Morgan Hawkins MRN: [id number] DOB/AGE: 1986-03-18 34 y.o.  Admit date: 10/10/2020 Discharge date: 10/11/2020  Discharge Diagnoses Patient Active Problem List   Diagnosis Date Noted  . Gallstone pancreatitis 10/10/2020    Consultants None  Procedures 10/10/2020:  Robotic assisted laparoscopic cholecystectomy  HPI: Morgan Hawkins [name] is a 34 y.o. female presented early this morning with abdominal pain.  Patient reports that the abdominal pain is located in the right upper quadrant is sharp and moderate to severe in nature.  She has had significant relief after narcotics were given here in the emergency room.  She also has decreased appetite and some nausea.  She denies any fevers and chills she also denies any respiratory symptoms.  No evidence of jaundice or biliary obstruction. She did have ultrasound that I have personally reviewed as well as a CT scan that I have also personally reviewed showing evidence of gallstone stuck in the neck of the gallbladder.  Changes might reflect some early acute cholecystitis.  More importantly CT scan does not show any complications related to pancreatitis.  She had lab work showing evidence of increased lipase but this has gone down within within a matter of 6 hours which is very encouraging indicating a very mild case of gallstone pancreatitis.  Her platelets are completely normal hemoglobin is 12.2.,  LFTs are completely normal except very mild elevation of the ALT that is likely reactive from her acute cholecystitis.  She is able to perform more than 6 METS of activity without any shortness of breath or chest pain.  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 Only past surgical history was a C-section and she did well. She saw Dr. Gwen Pounds in June 2021 for some palpitations and an EKG was performed with no evidence of any ischemic  changes and cardiologist deemed that this was not coronary in origin.  No further work-up was done at that time  Hospital Course: Informed consent was obtained and documented, and patient underwent uneventful robotic assisted laparoscopic cholecystectomy (Dr Everlene Farrier, 10/10/2020).  Post-operatively, patient's pain and symptoms improved/resolved and advancement of patient's diet and ambulation were well-tolerated. The remainder of patient's hospital course was essentially unremarkable, and discharge planning was initiated accordingly with patient safely able to be discharged home with appropriate discharge instructions, pain control, and outpatient follow-up after all of her questions were answered to her expressed satisfaction.   Discharge Condition: Good   Physical Examination:  Constitutional: Well appearing female, NAD Pulmonary: Normal effort, no respiratory distress Gastrointestinal: Soft, incisional soreness, non-distended, no rebound/guarding Skin: Laparoscopic incisions are CDI with dermabond, no erythema or drainage    Allergies as of 10/11/2020   No Known Allergies     Medication List    STOP taking these medications   cyclobenzaprine 10 MG tablet Commonly known as: FLEXERIL   HYDROcodone-acetaminophen 5-325 MG tablet Commonly known as: NORCO/VICODIN   meloxicam 15 MG tablet Commonly known as: MOBIC   methocarbamol 500 MG tablet Commonly known as: Robaxin   predniSONE 10 MG tablet Commonly known as: DELTASONE   traMADol 50 MG tablet Commonly known as: Ultram     TAKE these medications   ibuprofen 800 MG tablet Commonly known as: ADVIL Take 1 tablet (800 mg total) by mouth every 8 (eight) hours as needed. What changed:   medication strength  how much to take   oxyCODONE 5 MG immediate release tablet  Commonly known as: Oxy IR/ROXICODONE Take 1 tablet (5 mg total) by mouth every 3 (three) hours as needed for moderate pain.   UNKNOWN TO PATIENT Take 1 tablet  by mouth daily. Oral contraceptive, unsure of name         Follow-up Information    Pabon, [place] F, MD. Schedule an appointment as soon as possible for a visit in 2 week(s).   Specialty: General Surgery Why: s/p lap chole Contact information: [address] [place] [address] [phone]                Time spent on discharge management including discussion of hospital course, clinical condition, outpatient instructions, prescriptions, and follow up with the patient and members of the medical team: >30 minutes  -- Lynden Oxford , PA-C [facility]  10/11/2020, 8:38 AM [phone] M-F: 7am - 4pm

## 2020-10-12 LAB — SURGICAL PATHOLOGY

## 2020-10-13 ENCOUNTER — Ambulatory Visit: Payer: Medicaid Other

## 2020-10-25 ENCOUNTER — Encounter: Payer: Self-pay | Admitting: Surgery

## 2020-10-25 ENCOUNTER — Telehealth (INDEPENDENT_AMBULATORY_CARE_PROVIDER_SITE_OTHER): Payer: Medicaid Other | Admitting: Surgery

## 2020-10-25 ENCOUNTER — Other Ambulatory Visit: Payer: Self-pay

## 2020-10-25 DIAGNOSIS — Z09 Encounter for follow-up examination after completed treatment for conditions other than malignant neoplasm: Secondary | ICD-10-CM

## 2020-10-25 NOTE — Progress Notes (Addendum)
Virtual visit with the patient. I called her cell phone. She is status post robotic cholecystectomy. Pathology discussed with her in detail. She has no symptoms. She is tolerating regular diet and is pain-free. No fevers no chills and she has no concerns  Location of the patient:home. The patient had given consent for a telemedicine visit and they understands the limitations associated with this including but not limited to privacy, cyber security and technology issues.  A/P doing very well after robotic cholecystectomy. She knows to call back if she has any further questions or concerns.

## 2020-11-19 ENCOUNTER — Ambulatory Visit
Admission: RE | Admit: 2020-11-19 | Discharge: 2020-11-19 | Disposition: A | Discharge status: Home / Self Care | Payer: Medicaid Other | Source: Ambulatory Visit | Attending: Primary Care | Admitting: Primary Care

## 2020-11-19 ENCOUNTER — Other Ambulatory Visit: Payer: Self-pay

## 2020-11-19 DIAGNOSIS — N6320 Unspecified lump in the left breast, unspecified quadrant: Secondary | ICD-10-CM

## 2020-11-19 DIAGNOSIS — R928 Other abnormal and inconclusive findings on diagnostic imaging of breast: Secondary | ICD-10-CM | POA: Diagnosis present

## 2020-11-25 ENCOUNTER — Other Ambulatory Visit: Payer: Self-pay | Admitting: Primary Care

## 2020-11-25 DIAGNOSIS — N6320 Unspecified lump in the left breast, unspecified quadrant: Secondary | ICD-10-CM

## 2021-04-18 ENCOUNTER — Inpatient Hospital Stay: Admission: RE | Admit: 2021-04-18 | Payer: Medicaid Other | Source: Ambulatory Visit

## 2021-05-03 ENCOUNTER — Other Ambulatory Visit: Payer: Self-pay

## 2021-05-03 ENCOUNTER — Ambulatory Visit
Admission: RE | Admit: 2021-05-03 | Discharge: 2021-05-03 | Disposition: A | Discharge status: Home / Self Care | Payer: Medicaid Other | Source: Ambulatory Visit | Attending: Primary Care | Admitting: Primary Care

## 2021-05-03 DIAGNOSIS — N6320 Unspecified lump in the left breast, unspecified quadrant: Secondary | ICD-10-CM | POA: Diagnosis not present

## 2021-05-03 DIAGNOSIS — R928 Other abnormal and inconclusive findings on diagnostic imaging of breast: Secondary | ICD-10-CM | POA: Insufficient documentation

## 2021-11-10 IMAGING — MG DIGITAL DIAGNOSTIC BILAT W/ TOMO W/ CAD
8 of 16 series · 8 of 40 positions shown · non-contrast
Comparison: None.

CLINICAL DATA: 33-year-old female presenting with a lump and focal
tenderness in the left axilla. Patient had recent COVID vaccination
in the left arm.

EXAM:
DIGITAL DIAGNOSTIC BILATERAL MAMMOGRAM WITH CAD AND TOMO
ULTRASOUND LEFT BREAST

[L CC synth-2D (1 of 2)]
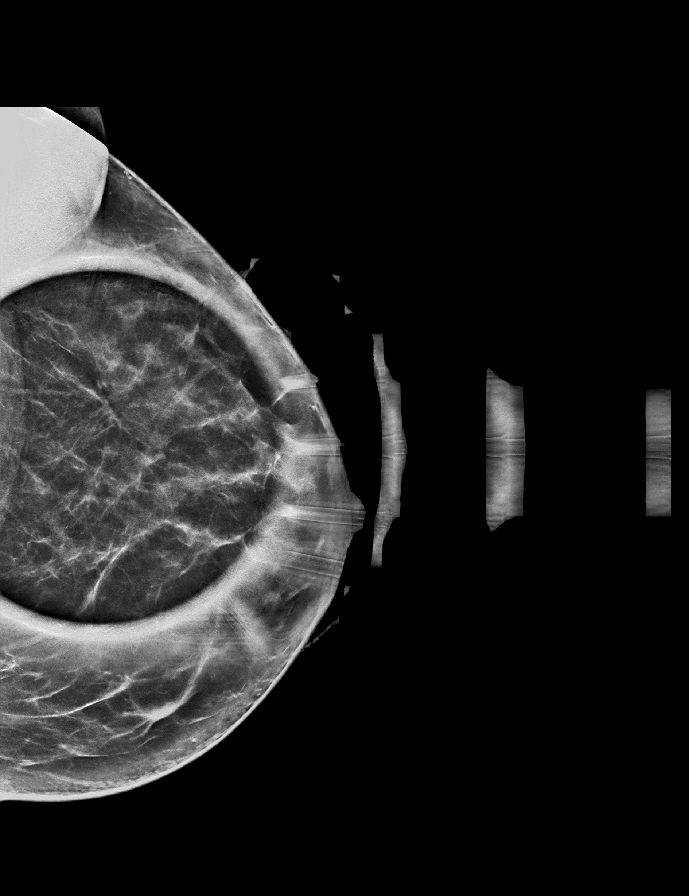

[R MLO synth-2D (1 of 2)]
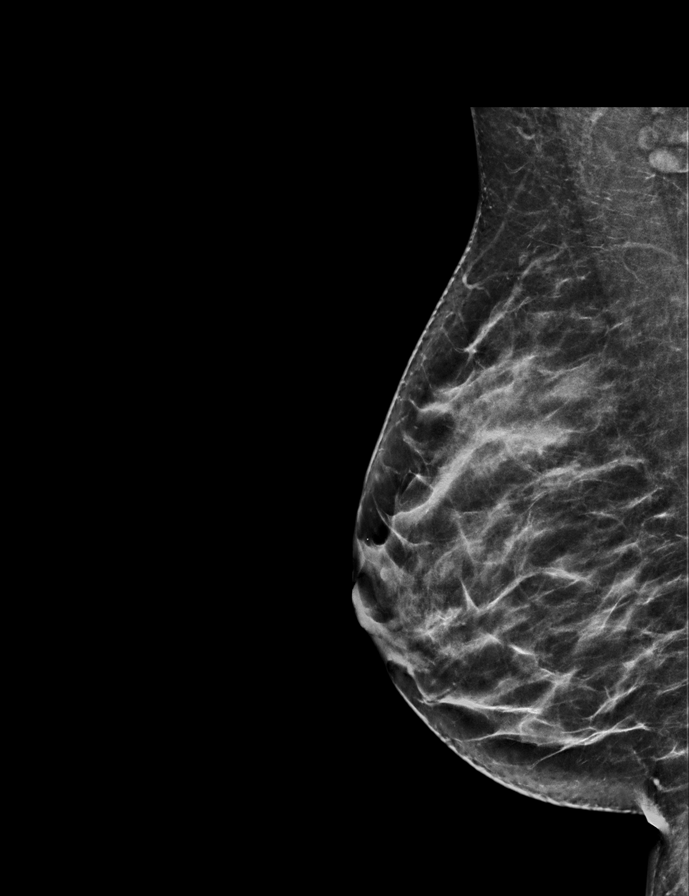

[R MLO synth-2D (2 of 2)]
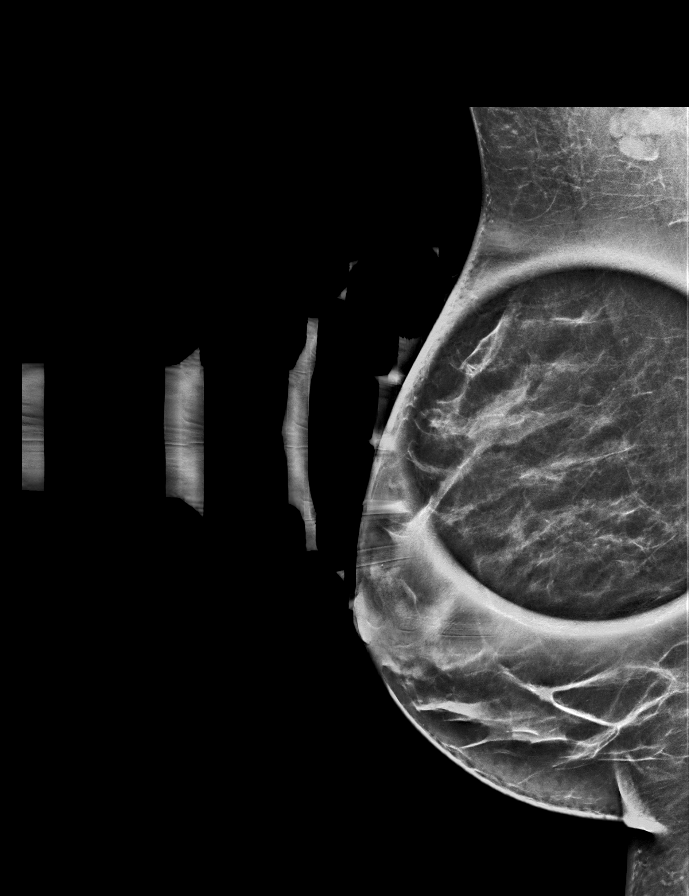

[L TAN synth-2D]
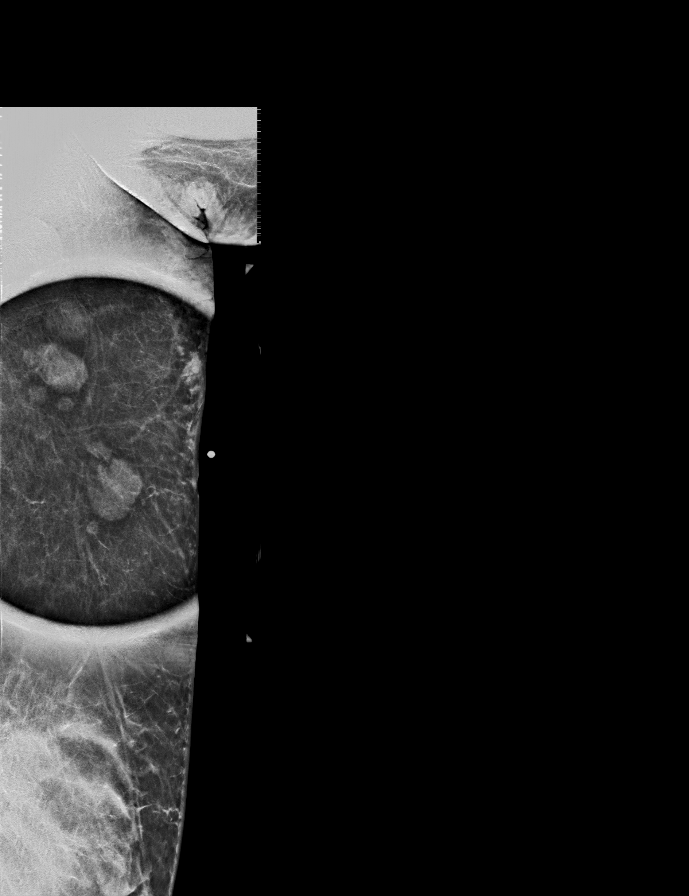

[L CC synth-2D (2 of 2)]
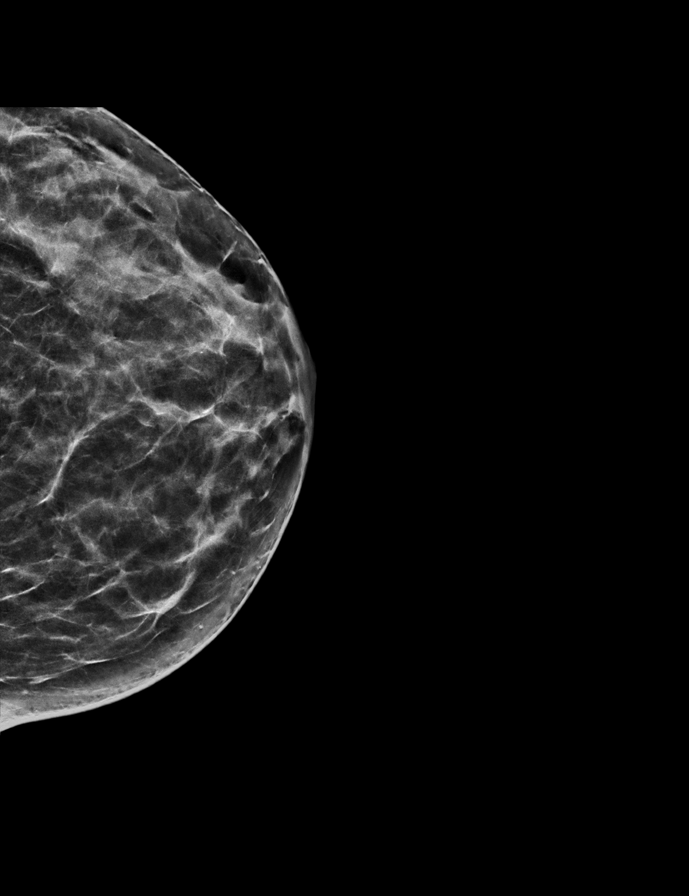

[R CC synth-2D]
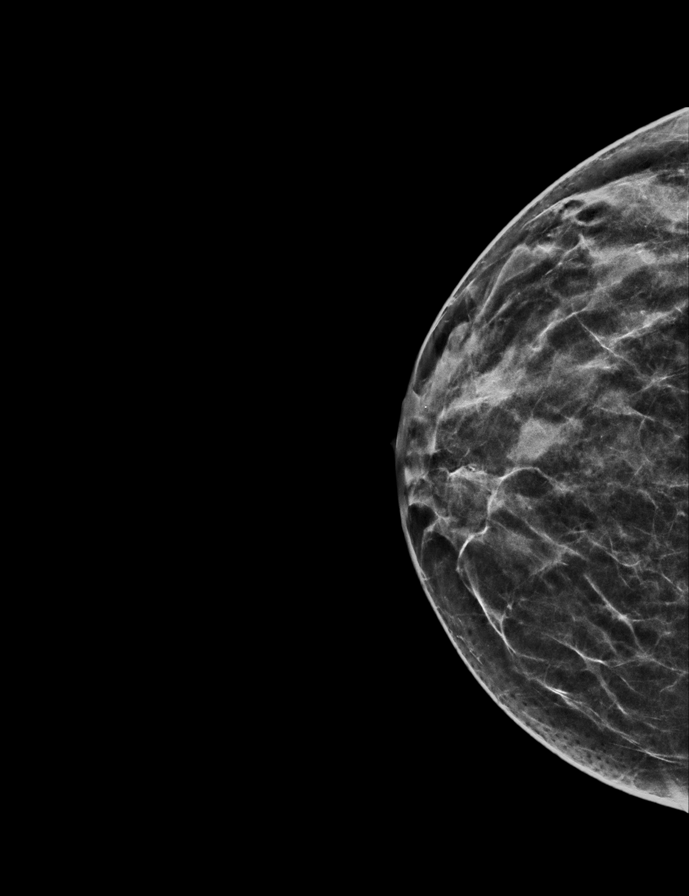

[L MLO synth-2D]
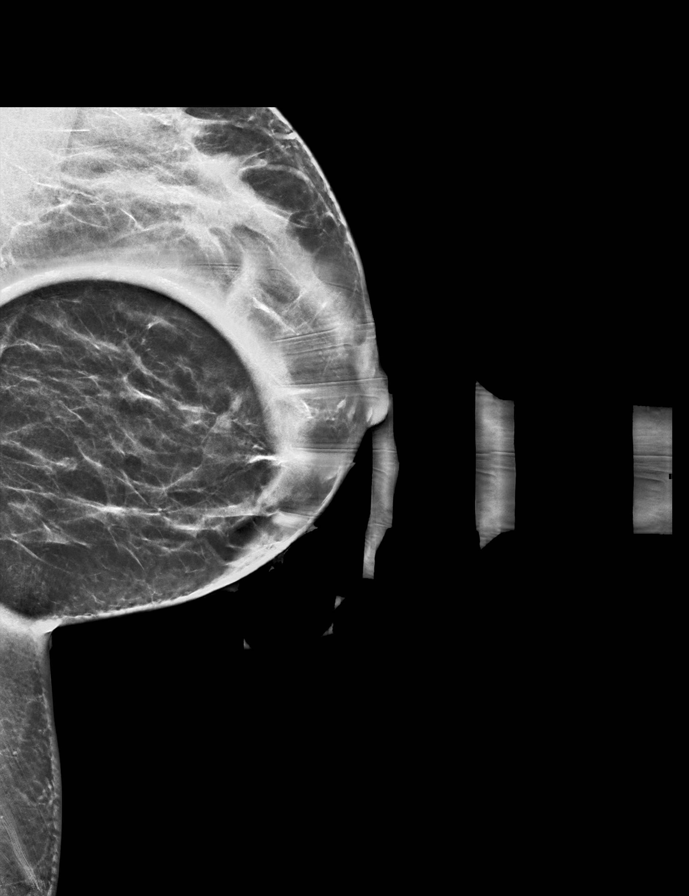

[L MLO tomo · tomo slice 32/63.0]
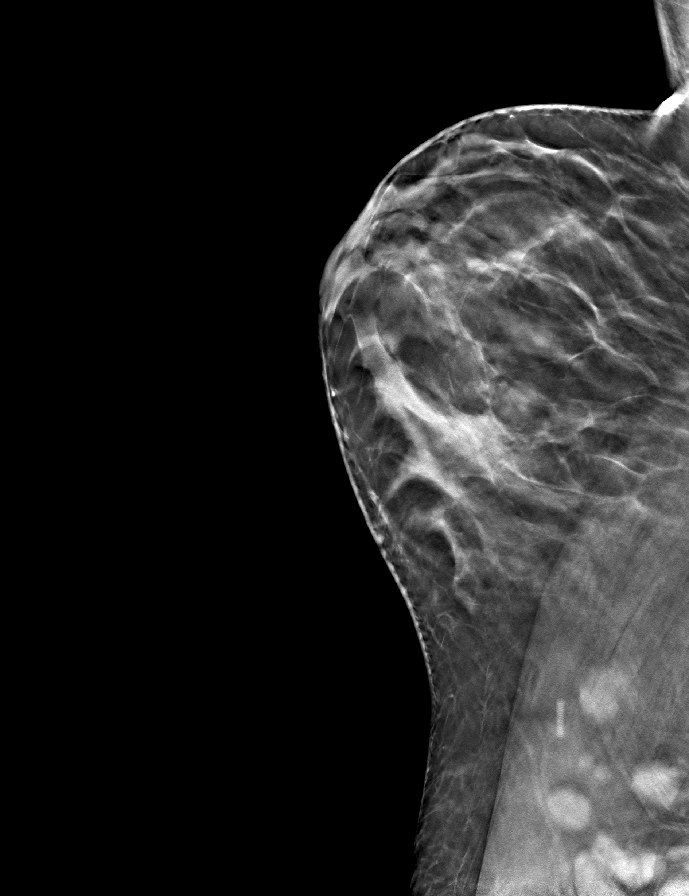

[8 of 40 positions shown; findings below may reference images not displayed]

ACR Breast Density Category c: The breast tissue is heterogeneously
dense, which may obscure small masses.
FINDINGS: Mammogram:

Right breast: Spot compression tomosynthesis views were performed in
addition to standard views to evaluate two asymmetries in the right
breast. On the additional imaging these asymmetries resolve,
consistent with overlapping fibroglandular tissue. No suspicious
mass, distortion, or microcalcifications are identified to suggest
presence of malignancy.

Left breast: A skin BB marks the palpable site of concern in the
left axilla. There are multiple mildly enlarged lymph nodes in the
left axilla which are asymmetric compared to the right. In the left
breast there is a small round circumscribed mass measuring 0.4 cm in
the far posteroinferior left breast. No additional findings in the
left breast.

Mammographic images were processed with CAD.

On physical exam, I palpate a small superficial mass at the site of
concern reported by the patient in the left axilla.

Ultrasound:

Targeted ultrasound is performed in the left axilla demonstrating
several mildly enlarged lymph nodes with cortical thickening
measuring up to 0.5 cm.

Targeted ultrasound performed throughout the inferior aspect of the
left breast demonstrate no discrete cystic or solid mass to
correspond to the finding identified mammographically.
IMPRESSION: 1. Multiple mildly enlarged left axillary lymph nodes at the
palpable site of concern aborted by the patient are probably benign
and most likely related to patient's recent COVID vaccination.

2. Left breast 4 mm mass at 6 o'clock is probably benign. This was
not visualized sonographically.

RECOMMENDATION:
1.  Left axillary ultrasound in 3 months.

2. Left breast diagnostic mammogram and possible ultrasound in 6
months.

I have discussed the findings and recommendations with the patient.
If applicable, a reminder letter will be sent to the patient
regarding the next appointment.

BI-RADS CATEGORY  3: Probably benign.

## 2021-12-29 ENCOUNTER — Other Ambulatory Visit: Payer: Self-pay | Admitting: Primary Care

## 2021-12-30 ENCOUNTER — Other Ambulatory Visit: Payer: Self-pay | Admitting: Primary Care

## 2021-12-30 DIAGNOSIS — N6320 Unspecified lump in the left breast, unspecified quadrant: Secondary | ICD-10-CM

## 2022-05-04 ENCOUNTER — Ambulatory Visit
Admission: RE | Admit: 2022-05-04 | Discharge: 2022-05-04 | Disposition: A | Discharge status: Home / Self Care | Payer: Medicaid Other | Source: Ambulatory Visit | Attending: Primary Care | Admitting: Primary Care

## 2022-05-04 DIAGNOSIS — Z1231 Encounter for screening mammogram for malignant neoplasm of breast: Secondary | ICD-10-CM | POA: Insufficient documentation

## 2022-05-04 DIAGNOSIS — N6320 Unspecified lump in the left breast, unspecified quadrant: Secondary | ICD-10-CM | POA: Insufficient documentation

## 2024-02-06 ENCOUNTER — Ambulatory Visit: Attending: Family Medicine

## 2024-02-06 DIAGNOSIS — R262 Difficulty in walking, not elsewhere classified: Secondary | ICD-10-CM | POA: Insufficient documentation

## 2024-02-06 DIAGNOSIS — M79671 Pain in right foot: Secondary | ICD-10-CM | POA: Insufficient documentation

## 2024-02-06 DIAGNOSIS — M6281 Muscle weakness (generalized): Secondary | ICD-10-CM | POA: Insufficient documentation

## 2024-02-06 DIAGNOSIS — G8929 Other chronic pain: Secondary | ICD-10-CM | POA: Insufficient documentation

## 2024-02-06 DIAGNOSIS — M5459 Other low back pain: Secondary | ICD-10-CM | POA: Diagnosis present

## 2024-02-06 DIAGNOSIS — M25561 Pain in right knee: Secondary | ICD-10-CM | POA: Diagnosis present

## 2024-02-06 NOTE — Therapy (Signed)
 OUTPATIENT PHYSICAL THERAPY LE EVALUATION   Patient Name: Morgan Hawkins MRN: [id number] DOB:17-Nov-1986, 38 y.o., female Today's Date: 02/06/2024  END OF SESSION:  PT End of Session - 02/06/24 1708     Visit Number 1    Number of Visits 25    Date for PT Re-Evaluation 04/30/24    PT Start Time 1445    PT Stop Time 1529    PT Time Calculation (min) 44 min    Activity Tolerance Patient tolerated treatment well    Behavior During Therapy [facility] for tasks assessed/performed             Past Medical History:  Diagnosis Date   Asthma    Past Surgical History:  Procedure Laterality Date   CESAREAN SECTION  2014   Patient Active Problem List   Diagnosis Date Noted   Gallstone pancreatitis 10/10/2020    [facility]P: [facility]  REFERRING PROVIDER:  Oswaldo Conroy, MD    REFERRING DIAG:  Diagnosis  M25.561 (ICD-10-CM) - Pain in right knee  M25.571 (ICD-10-CM) - Pain in right ankle and joints of right foot    Rationale for Evaluation and Treatment: Rehabilitation  THERAPY DIAG:  Chronic pain of right knee  Pain in right foot  Muscle weakness (generalized)  Difficulty in walking, not elsewhere classified  ONSET DATE: December 31st, 2024   SUBJECTIVE:                                                                                                                                                                                           SUBJECTIVE STATEMENT: The pt is a pleasant 38 y/o female presenting to PT for R knee, ankle and foot pain. Pt not entirely sure of MOI, but does report fall onto her back/hitting her back December 31st. She did not feel any pain immediately following the fall, but says she woke up the next day with RLE pain.  Pain is triggered by movement following prolonged stillness/rest and takes minutes to improve. This has not improved at all since onset. The pain causes her to limp. She feels it primarily from the  front of her knee with it wrapping around to the back. Trying to put a sock on her R foot causes her R toes to hurt, describes it as a nerve-like pain or a pulling sensation that travels from the inside of her knee to toes 3-5. She feels a numbness in her RLE that extends from her knee to her foot if she is in one position for too long. She can have some burning sensation. Due to the pain she  sometimes has to grab onto things when up/moving. She reports no hx of imaging s/p fall.   PERTINENT HISTORY:  Per chart hx of L breast mass, gallstone pancreatitis  PAIN:  Are you having pain?  Worst pain: 6-8/10, if still can be 0/10  Tries to move her leg to improve pain.  PRECAUTIONS:  None  RED FLAGS: None   WEIGHT BEARING RESTRICTIONS:  No  FALLS:  Has patient fallen in last 6 months? Yes. Number of falls 1  LIVING ENVIRONMENT: Lives with: lives with their spouse and two kids Lives in: House/apartment Stairs:  3, hand rails both sides. Holds on initially     OCCUPATION:  Works from home, sitting most of the time  PLOF:  Independent  PATIENT GOALS:  Get rid of the pain  OBJECTIVE:  Note: Objective measures were completed at Evaluation unless otherwise noted.  DIAGNOSTIC FINDINGS:  No pertinent imaging in chart, pt reports she has not had imaging taken of her LE  PATIENT SURVEYS:  LEFS 66/80  COGNITIVE STATUS: Within functional limits for tasks assessed   SENSATION: Majority of testing LE WNL, exception lateral RLE reports slight less sensation to light-touch   EDEMA:  No  POSTURE:  Weight shift to LLE in standing   GAIT: Distance walked: clinic distances ( ) Assistive device utilized: None Level of assistance: Complete Independence Comments: see below for details   Functional Tests: : 0.98 m/s; pt reports feeling increased weightbearing through LLE, observed elevation of R shoulder, decreased rotation, increased bilat pronation, bilat hip drop  observed    Body Part #1 Knee    Hamstring length: Lacking 5-10 degree full range bilaterally, and painful at end-range on R with pulling sensation, does feel this on L but not as intense    PALPATION: RLE Not pain with palpation lateral, medial, anterior or posterior   No pain with R patellar mobs inferior, superior, medial or lateral, normal quality of movement   LOWER EXTREMITY MMT:    LLE: strength grossly 4+/5 majority deficits found with proximal mm  RLE: strength grossly 4+/5 majority of deficits found with proximal mm *R Hip flexion is pain limited 4/5 where pain is felt wrapping around medial-lat aspect of R knee to R posterior knee   SPECIAL TESTS:  RLE: Lower Extremity Knee special tests: Anterior drawer test: negative, Posterior drawer test: negative, and Thessaly test: negative bilat but reports feeling unstable feeling standing on RLE     Body Part #2 Ankle   LOWER EXTREMITY ROM:     Pain with movement into valgus only, felt in last two digits of foot, pulling sensation, all other R ankle motions are pain-free   Lower Extremity Ankle special tests: Anterior drawer test: negative    Body Part #3: thoracolumbar  ROM: screen completed/observation, formal measurement not taken Flexion - observed WNL, no pain Extension - observed WNL, no pain Lateral flexion - observed WNL, no pain Rotation - possible increase in ROM bilat, pain felt in back of R knee with rotation to R side, no pain with rotation to L side   Repeated Motions: 10 reps of the following Seated: flexion - does recreate pain felt bottom R foot Standing flex/extension - no pain  Other: Pt with pain in RLE with seated FABER stretch position  TREATMENT DATE: 02/06/24  TA: PT reviewed findings of assessment, goals/tests, plan and HEP. Pt completed 2-3 sets of prescribed  HEP to confirm correct technique/understanding.    PATIENT EDUCATION:  Education details: assessment findings, goals, plan, HEP Person educated: Patient and Spouse Education method: Explanation, Demonstration, Verbal cues, and Handouts Education comprehension: verbalized understanding and returned demonstration  HOME EXERCISE PROGRAM: Access Code: [email or web address] URL: [email or web address] Date: 02/06/2024 Prepared by: Temple Pacini  Exercises - Clamshell  - 1 x daily - 5-6 x weekly - 3-4 sets - 10 reps - Supine March with Alternating Leg Lifts  - 1 x daily - 5-6 x weekly - 2 sets - 20 reps   ASSESSMENT:  CLINICAL IMPRESSION: Patient is a pleasant 38 y.o. female who was seen today for physical therapy evaluation and treatment for R knee, ankle and foot pain s/p fall onto her back December 2024. Assessment findings indicate impaired sensation, pain, decreased BLE strength, decreased hamstring length, and impaired gait mechanics/speed, and pain with particular movements such as R trunk rotation, RLE seated FABER positioning. Current assessment findings suggest pt's pain might be originating from low back, which warrants further assessment future visits. HEP initiated to address deficits, plan to add next 1-2 sessions. The pt will benefit from further skilled PT to improve strength, gait, mobility and pain to increase QOL and return pt to PLOF.   OBJECTIVE IMPAIRMENTS: Abnormal gait, decreased activity tolerance, decreased mobility, difficulty walking, decreased ROM, decreased strength, impaired flexibility, impaired sensation, improper body mechanics, postural dysfunction, and pain.   ACTIVITY LIMITATIONS: lifting, sitting, standing, squatting, stairs, transfers, dressing, and locomotion level  PARTICIPATION LIMITATIONS: meal prep, cleaning, shopping, community activity, and yard work  PERSONAL FACTORS: Sex and Time since onset of injury/illness/exacerbation are also affecting  patient's functional outcome.   REHAB POTENTIAL: Good  CLINICAL DECISION MAKING: Stable/uncomplicated  EVALUATION COMPLEXITY: Moderate   GOALS: Goals reviewed with patient? Yes   SHORT TERM GOALS: Target date: 03/19/2024    Patient will be independent in home exercise program to improve strength/mobility for better functional independence with ADLs. Baseline: initiated  Goal status: INITIAL   LONG TERM GOALS: Target date: 04/30/2024  Patient will increase lower extremity functional scale to >60/80 to demonstrate improved functional mobility and increased tolerance with ADLs.  Baseline: 66 Goal status: INITIAL  2.  Patient (> 24 years old) will complete five times sit to stand test in < 15 seconds indicating an increased LE strength and improved balance. Baseline: deferred Goal status: INITIAL  3.  The pt will report a worst R knee pain of no greater than 3/10 within the past two weeks to indicate improved QOL Baseline: 6-8/10 Goal status: INITIAL  4.  Patient will increase 10 meter walk test to >1.57m/s as to improve gait speed for better community ambulation and to reduce fall risk. Baseline:  0.98 m/s Goal status: INITIAL     PLAN:  PT FREQUENCY: 1-2x/week  PT DURATION: 12 weeks  PLANNED INTERVENTIONS: 97164- PT Re-evaluation, 97110-Therapeutic exercises, 97530- Therapeutic activity, 97112- Neuromuscular re-education, 97535- Self Care, 24401- Manual therapy, [phone]- Gait training, [phone]- Orthotic Fit/training, Patient/Family education, Balance training, Stair training, Taping, Dry Needling, Joint mobilization, Spinal mobilization, DME instructions, Cryotherapy, and Moist heat.  PLAN FOR NEXT SESSION: 5xSTS, palpate/assess lumbar and t-spine further, strengthening, add to HEP   Baird Kay, PT 02/06/2024, 5:29 PM

## 2024-02-14 ENCOUNTER — Ambulatory Visit: Admitting: Physical Therapy

## 2024-02-14 DIAGNOSIS — R262 Difficulty in walking, not elsewhere classified: Secondary | ICD-10-CM

## 2024-02-14 DIAGNOSIS — M6281 Muscle weakness (generalized): Secondary | ICD-10-CM

## 2024-02-14 DIAGNOSIS — G8929 Other chronic pain: Secondary | ICD-10-CM

## 2024-02-14 DIAGNOSIS — M25561 Pain in right knee: Secondary | ICD-10-CM | POA: Diagnosis not present

## 2024-02-14 DIAGNOSIS — M79671 Pain in right foot: Secondary | ICD-10-CM

## 2024-02-14 NOTE — Therapy (Signed)
 OUTPATIENT PHYSICAL THERAPY LE TREATMENT   Patient Name: Morgan Hawkins MRN: [id number] DOB:1986-05-19, 38 y.o., female Today's Date: 02/14/2024  END OF SESSION:  PT End of Session - 02/14/24 1222     Visit Number 2    Number of Visits 25    Date for PT Re-Evaluation 04/30/24    Progress Note Due on Visit 10    PT Start Time 1158    PT Stop Time 1229    PT Time Calculation (min) 31 min    Activity Tolerance Patient tolerated treatment well    Behavior During Therapy [facility] for tasks assessed/performed              Past Medical History:  Diagnosis Date   Asthma    Past Surgical History:  Procedure Laterality Date   CESAREAN SECTION  2014   Patient Active Problem List   Diagnosis Date Noted   Gallstone pancreatitis 10/10/2020    [facility]P: [facility]  REFERRING PROVIDER:  Oswaldo Conroy, MD    REFERRING DIAG:  Diagnosis  M25.561 (ICD-10-CM) - Pain in right knee  M25.571 (ICD-10-CM) - Pain in right ankle and joints of right foot    Rationale for Evaluation and Treatment: Rehabilitation  THERAPY DIAG:  Chronic pain of right knee  Pain in right foot  Muscle weakness (generalized)  Difficulty in walking, not elsewhere classified  ONSET DATE: December 31st, 2024   SUBJECTIVE:                                                                                                                                                                                           SUBJECTIVE STATEMENT: Patient reports no significant changes since last session. PERTINENT HISTORY:  Per chart hx of L breast mass, gallstone pancreatitis  PAIN:  Are you having pain?  Worst pain: 6-8/10, if still can be 0/10  Tries to move her leg to improve pain.  PRECAUTIONS:  None  RED FLAGS: None   WEIGHT BEARING RESTRICTIONS:  No  FALLS:  Has patient fallen in last 6 months? Yes. Number of falls 1  LIVING ENVIRONMENT: Lives with: lives with  their spouse and two kids Lives in: House/apartment Stairs:  3, hand rails both sides. Holds on initially     OCCUPATION:  Works from home, sitting most of the time  PLOF:  Independent  PATIENT GOALS:  Get rid of the pain  OBJECTIVE:  Note: Objective measures were completed at Evaluation unless otherwise noted.  DIAGNOSTIC FINDINGS:  No pertinent imaging in chart, pt reports she has not had imaging taken of her LE  PATIENT  SURVEYS:  LEFS 66/80  COGNITIVE STATUS: Within functional limits for tasks assessed   SENSATION: Majority of testing LE WNL, exception lateral RLE reports slight less sensation to light-touch   EDEMA:  No  POSTURE:  Weight shift to LLE in standing   GAIT: Distance walked: clinic distances ( ) Assistive device utilized: None Level of assistance: Complete Independence Comments: see below for details   Functional Tests: : 0.98 m/s; pt reports feeling increased weightbearing through LLE, observed elevation of R shoulder, decreased rotation, increased bilat pronation, bilat hip drop observed    Body Part #1 Knee    Hamstring length: Lacking 5-10 degree full range bilaterally, and painful at end-range on R with pulling sensation, does feel this on L but not as intense    PALPATION: RLE Not pain with palpation lateral, medial, anterior or posterior   No pain with R patellar mobs inferior, superior, medial or lateral, normal quality of movement   LOWER EXTREMITY MMT:    LLE: strength grossly 4+/5 majority deficits found with proximal mm  RLE: strength grossly 4+/5 majority of deficits found with proximal mm *R Hip flexion is pain limited 4/5 where pain is felt wrapping around medial-lat aspect of R knee to R posterior knee   SPECIAL TESTS:  RLE: Lower Extremity Knee special tests: Anterior drawer test: negative, Posterior drawer test: negative, and Thessaly test: negative bilat but reports feeling unstable feeling standing  on RLE     Body Part #2 Ankle   LOWER EXTREMITY ROM:     Pain with movement into valgus only, felt in last two digits of foot, pulling sensation, all other R ankle motions are pain-free   Lower Extremity Ankle special tests: Anterior drawer test: negative    Body Part #3: thoracolumbar  ROM: screen completed/observation, formal measurement not taken Flexion - observed WNL, no pain Extension - observed WNL, no pain Lateral flexion - observed WNL, no pain Rotation - possible increase in ROM bilat, pain felt in back of R knee with rotation to R side, no pain with rotation to L side   Repeated Motions: 10 reps of the following Seated: flexion - does recreate pain felt bottom R foot Standing flex/extension - no pain  Other: Pt with pain in RLE with seated FABER stretch position                                                                                                                            TREATMENT DATE: 02/14/24 TE- To improve strength, endurance, mobility, and function of specific targeted muscle groups or improve joint range of motion or improve muscle flexibility  5XSTS: see goals section   L4/5 R sided discomfort with palpation as well as gluteal discomfort   Positive SLR test on R ( ankle and post LE pain reproduced)   R LE nerve glides x 20 reps   Supine TrA activation 2 x 10 reps, cues for palpation of TrA to ensure successful  activation   Seated R LE sciatic nerve glide x 10 reps  PATIENT EDUCATION:  Education details: assessment findings, goals, plan, HEP Person educated: Patient and Spouse Education method: Explanation, Demonstration, Verbal cues, and Handouts Education comprehension: verbalized understanding and returned demonstration  HOME EXERCISE PROGRAM: Access Code: [email or web address] URL: [email or web address] Date: 02/14/2024 Prepared by: Thresa Ross  Exercises - Supine Lower Trunk Rotation  - 1 x daily - 5-6 x weekly - 1  sets - 10 reps - 5 sec hold - Supine Transversus Abdominis Bracing - Hands on Stomach  - 1 x daily - 5-6 x weekly - 2 sets - 10 reps - 3 sec hold - Supine march with TrA bracing   - 1 x daily - 5-6 x weekly - 2 sets - 10 reps - Clamshell  - 1 x daily - 5-6 x weekly - 3-4 sets - 10 reps - Seated Sciatic Tensioner  - 1 x daily - 5-6 x weekly - 2 sets - 10 reps   ASSESSMENT:  CLINICAL IMPRESSION:  Patient presents with good motivation for completion of physical therapy activities.  Patient further evaluation continues to indicate involvement of lumbar spine likely L4/L5 nerve root.  Patient instructed that she may continue with physical therapy for several weeks but she does not show improvement it was recommended that she establish an appointment with her primary care or another physician to ensure she does not have any fractures or other things outside of the scope of physical therapy.  Patient also instructed to be careful with exercises is not to exacerbate her pain.  Patient did respond well to exercises this date reporting some relief of symptoms following seated sciatic nerve glides.Pt will continue to benefit from skilled physical therapy intervention to address impairments, improve QOL, and attain therapy goals.     OBJECTIVE IMPAIRMENTS: Abnormal gait, decreased activity tolerance, decreased mobility, difficulty walking, decreased ROM, decreased strength, impaired flexibility, impaired sensation, improper body mechanics, postural dysfunction, and pain.   ACTIVITY LIMITATIONS: lifting, sitting, standing, squatting, stairs, transfers, dressing, and locomotion level  PARTICIPATION LIMITATIONS: meal prep, cleaning, shopping, community activity, and yard work  PERSONAL FACTORS: Sex and Time since onset of injury/illness/exacerbation are also affecting patient's functional outcome.   REHAB POTENTIAL: Good  CLINICAL DECISION MAKING: Stable/uncomplicated  EVALUATION COMPLEXITY:  Moderate   GOALS: Goals reviewed with patient? Yes   SHORT TERM GOALS: Target date: 03/19/2024    Patient will be independent in home exercise program to improve strength/mobility for better functional independence with ADLs. Baseline: initiated  Goal status: INITIAL   LONG TERM GOALS: Target date: 04/30/2024  Patient will increase lower extremity functional scale to >60/80 to demonstrate improved functional mobility and increased tolerance with ADLs.  Baseline: 66 Goal status: INITIAL  2.  Patient (> 43 years old) will complete five times sit to stand test in < 15 seconds indicating an increased LE strength and improved balance. Baseline: deferred 3/13: 13.02 sec  Goal status: INITIAL  3.  The pt will report a worst R knee pain of no greater than 3/10 within the past two weeks to indicate improved QOL Baseline: 6-8/10 Goal status: INITIAL  4.  Patient will increase 10 meter walk test to >1.58m/s as to improve gait speed for better community ambulation and to reduce fall risk. Baseline:  0.98 m/s Goal status: INITIAL     PLAN:  PT FREQUENCY: 1-2x/week  PT DURATION: 12 weeks  PLANNED INTERVENTIONS: 97164- PT Re-evaluation, 97110-Therapeutic exercises, 97530- Therapeutic activity, [id number]- Neuromuscular re-education,  72536- Self Care, 64403- Manual therapy, [phone]- Gait training, [phone]- Orthotic Fit/training, Patient/Family education, Balance training, Stair training, Taping, Dry Needling, Joint mobilization, Spinal mobilization, DME instructions, Cryotherapy, and Moist heat.  PLAN FOR NEXT SESSION: strengthening, add to HEP, assess if lumbar spine interventions are working.  Continuing to suspect L4/L5 nerve root compression.  If interventions are ineffective recommend imaging study to rule out other pathologies.   Norman Herrlich, PT 02/14/2024, 12:41 PM

## 2024-02-19 ENCOUNTER — Ambulatory Visit

## 2024-02-19 DIAGNOSIS — M5459 Other low back pain: Secondary | ICD-10-CM

## 2024-02-19 DIAGNOSIS — M6281 Muscle weakness (generalized): Secondary | ICD-10-CM

## 2024-02-19 DIAGNOSIS — G8929 Other chronic pain: Secondary | ICD-10-CM

## 2024-02-19 DIAGNOSIS — M25561 Pain in right knee: Secondary | ICD-10-CM | POA: Diagnosis not present

## 2024-02-19 NOTE — Therapy (Signed)
 OUTPATIENT PHYSICAL THERAPY LE TREATMENT   Patient Name: Morgan Hawkins MRN: [id number] DOB:July 02, 1986, 38 y.o., female Today's Date: 02/19/2024  END OF SESSION:     Past Medical History:  Diagnosis Date   Asthma    Past Surgical History:  Procedure Laterality Date   CESAREAN SECTION  2014   Patient Active Problem List   Diagnosis Date Noted   Gallstone pancreatitis 10/10/2020    PCP: [facility]  REFERRING PROVIDER:  Oswaldo Conroy, MD    REFERRING DIAG:  Diagnosis  M25.561 (ICD-10-CM) - Pain in right knee  M25.571 (ICD-10-CM) - Pain in right ankle and joints of right foot    Rationale for Evaluation and Treatment: Rehabilitation  THERAPY DIAG:  No diagnosis found.  ONSET DATE: December 31st, 2024   SUBJECTIVE:                                                                                                                                                                                           SUBJECTIVE STATEMENT: Pt reports pain has gotten worse since she started doing her HEP/exercises, and she is experiencing pain in R low back now. She reports it was painful to get onto knees to pick things up/complete ADLs. Pt reports no pain during the exercises but notices increased pain with daily activities. Pt reports pain into knee and foot also worsening.  Pt reports TrP in low back identified last time is what is hurting during the day. Feels a jerk if on side in R low back, didn't used to feel this.  PERTINENT HISTORY:  Per chart hx of L breast mass, gallstone pancreatitis  PAIN:  Are you having pain?  Worst pain: 6-8/10, if still can be 0/10  Tries to move her leg to improve pain.  PRECAUTIONS:  None  RED FLAGS: None   WEIGHT BEARING RESTRICTIONS:  No  FALLS:  Has patient fallen in last 6 months? Yes. Number of falls 1  LIVING ENVIRONMENT: Lives with: lives with their spouse and two kids Lives in:  House/apartment Stairs:  3, hand rails both sides. Holds on initially     OCCUPATION:  Works from home, sitting most of the time  PLOF:  Independent  PATIENT GOALS:  Get rid of the pain  OBJECTIVE:  Note: Objective measures were completed at Evaluation unless otherwise noted.  DIAGNOSTIC FINDINGS:  No pertinent imaging in chart, pt reports she has not had imaging taken of her LE  PATIENT SURVEYS:  LEFS 66/80  COGNITIVE STATUS: Within functional limits for tasks assessed   SENSATION: Majority of testing LE WNL, exception lateral RLE  reports slight less sensation to light-touch   EDEMA:  No  POSTURE:  Weight shift to LLE in standing   GAIT: Distance walked: clinic distances ( ) Assistive device utilized: None Level of assistance: Complete Independence Comments: see below for details   Functional Tests: : 0.98 m/s; pt reports feeling increased weightbearing through LLE, observed elevation of R shoulder, decreased rotation, increased bilat pronation, bilat hip drop observed    Body Part #1 Knee    Hamstring length: Lacking 5-10 degree full range bilaterally, and painful at end-range on R with pulling sensation, does feel this on L but not as intense    PALPATION: RLE Not pain with palpation lateral, medial, anterior or posterior   No pain with R patellar mobs inferior, superior, medial or lateral, normal quality of movement   LOWER EXTREMITY MMT:    LLE: strength grossly 4+/5 majority deficits found with proximal mm  RLE: strength grossly 4+/5 majority of deficits found with proximal mm *R Hip flexion is pain limited 4/5 where pain is felt wrapping around medial-lat aspect of R knee to R posterior knee   SPECIAL TESTS:  RLE: Lower Extremity Knee special tests: Anterior drawer test: negative, Posterior drawer test: negative, and Thessaly test: negative bilat but reports feeling unstable feeling standing on RLE     Body Part #2  Ankle   LOWER EXTREMITY ROM:     Pain with movement into valgus only, felt in last two digits of foot, pulling sensation, all other R ankle motions are pain-free   Lower Extremity Ankle special tests: Anterior drawer test: negative    Body Part #3: thoracolumbar  ROM: screen completed/observation, formal measurement not taken Flexion - observed WNL, no pain Extension - observed WNL, no pain Lateral flexion - observed WNL, no pain Rotation - possible increase in ROM bilat, pain felt in back of R knee with rotation to R side, no pain with rotation to L side   Repeated Motions: 10 reps of the following Seated: flexion - does recreate pain felt bottom R foot Standing flex/extension - no pain  Other: Pt with pain in RLE with seated FABER stretch position                                                                                                                            TREATMENT DATE: 02/19/24  TE- To improve strength, endurance, mobility, and function of specific targeted muscle groups or improve joint range of motion or improve muscle flexibility  Heat to R low back (pt reports nothing applied to skin that would prevent safe use of heat, no adverse reaction to heat)   On mat table: TrA activation 10x with 5 sec hold/rep  TrA activation with pball adductor squeeze 2x10x 3 sec hold/rep  LTRs 2x10 each side  - some soreness felt in quads  Green pball hamstring curls - 2x10 brief electric/burning sensation (brief) when initially getting into position  [facility] March 2x10 each LE -  easy  Faber stretch (modified for comfortable positioning RLE) x60 sec each side with pt completed TrA activation throughout Bridge 2x10 - easy Open book (seated) 10x each UE   Seated hamstring stretch x 60 sec each LE  PT reprinted and reviewed which two exercises to perform over the next week. Do not perform the non-starred exercises, log/track symptoms throughout the week.  *- Supine Lower  Trunk Rotation  - 1 x daily - 5-6 x weekly - 1 sets - 10 reps - 5 sec hold *- Supine Transversus Abdominis Bracing - Hands on Stomach  - 1 x daily - 5-6 x weekly - 2 sets - 10 reps - 3 sec hold - Supine march with TrA bracing   - 1 x daily - 5-6 x weekly - 2 sets - 10 reps - Clamshell  - 1 x daily - 5-6 x weekly - 3-4 sets - 10 reps - Seated Sciatic Tensioner  - 1 x daily - 5-6 x weekly - 2 sets - 10 reps    PATIENT EDUCATION:  Education details: exercise technique, plan, HEP update Person educated: Patient Education method: Explanation, Demonstration, Verbal cues, and Handouts Education comprehension: verbalized understanding and returned demonstration  HOME EXERCISE PROGRAM: Access Code: [email or web address] URL: [email or web address] Date: 02/14/2024 Prepared by: Thresa Ross  Exercises - Supine Lower Trunk Rotation  - 1 x daily - 5-6 x weekly - 1 sets - 10 reps - 5 sec hold - Supine Transversus Abdominis Bracing - Hands on Stomach  - 1 x daily - 5-6 x weekly - 2 sets - 10 reps - 3 sec hold - Supine march with TrA bracing   - 1 x daily - 5-6 x weekly - 2 sets - 10 reps - Clamshell  - 1 x daily - 5-6 x weekly - 3-4 sets - 10 reps - Seated Sciatic Tensioner  - 1 x daily - 5-6 x weekly - 2 sets - 10 reps   ASSESSMENT:  CLINICAL IMPRESSION: Pt reported to PT pain not during exercises or HEP since last visit, but general increase in pain with ADLs during the week. PT updated HEP, instructing pt to focus on only two of the interventions (see above), until next seen and to log her symptoms and report back to PT.  Pt tolerated all interventions completed in session well, exception was brief pain felt with getting into position for hamstring curls with pball. Will continue to monitor & if pt continues to worsen refer back to MD. Pt will continue to benefit from skilled physical therapy intervention to address impairments, improve QOL, and attain therapy goals.     OBJECTIVE  IMPAIRMENTS: Abnormal gait, decreased activity tolerance, decreased mobility, difficulty walking, decreased ROM, decreased strength, impaired flexibility, impaired sensation, improper body mechanics, postural dysfunction, and pain.   ACTIVITY LIMITATIONS: lifting, sitting, standing, squatting, stairs, transfers, dressing, and locomotion level  PARTICIPATION LIMITATIONS: meal prep, cleaning, shopping, community activity, and yard work  PERSONAL FACTORS: Sex and Time since onset of injury/illness/exacerbation are also affecting patient's functional outcome.   REHAB POTENTIAL: Good  CLINICAL DECISION MAKING: Stable/uncomplicated  EVALUATION COMPLEXITY: Moderate   GOALS: Goals reviewed with patient? Yes   SHORT TERM GOALS: Target date: 03/19/2024    Patient will be independent in home exercise program to improve strength/mobility for better functional independence with ADLs. Baseline: initiated  Goal status: INITIAL   LONG TERM GOALS: Target date: 04/30/2024  Patient will increase lower extremity functional scale to >60/80 to demonstrate improved functional mobility and  increased tolerance with ADLs.  Baseline: 66 Goal status: INITIAL  2.  Patient (> 48 years old) will complete five times sit to stand test in < 15 seconds indicating an increased LE strength and improved balance. Baseline: deferred 3/13: 13.02 sec  Goal status: INITIAL  3.  The pt will report a worst R knee pain of no greater than 3/10 within the past two weeks to indicate improved QOL Baseline: 6-8/10 Goal status: INITIAL  4.  Patient will increase 10 meter walk test to >1.32m/s as to improve gait speed for better community ambulation and to reduce fall risk. Baseline:  0.98 m/s Goal status: INITIAL     PLAN:  PT FREQUENCY: 1-2x/week  PT DURATION: 12 weeks  PLANNED INTERVENTIONS: 97164- PT Re-evaluation, 97110-Therapeutic exercises, 97530- Therapeutic activity, 97112- Neuromuscular re-education, 97535-  Self Care, 91478- Manual therapy, [phone]- Gait training, [phone]- Orthotic Fit/training, Patient/Family education, Balance training, Stair training, Taping, Dry Needling, Joint mobilization, Spinal mobilization, DME instructions, Cryotherapy, and Moist heat.  PLAN FOR NEXT SESSION: strengthening, add to HEP, assess if lumbar spine interventions are working.  Continuing to suspect L4/L5 nerve root compression.  If interventions are ineffective recommend imaging study to rule out other pathologies.   Baird Kay, PT 02/19/2024, 1:19 PM

## 2024-02-20 ENCOUNTER — Encounter: Admitting: Physical Therapy

## 2024-02-25 ENCOUNTER — Encounter

## 2024-02-27 ENCOUNTER — Ambulatory Visit

## 2024-02-27 DIAGNOSIS — M5459 Other low back pain: Secondary | ICD-10-CM

## 2024-02-27 DIAGNOSIS — M25561 Pain in right knee: Secondary | ICD-10-CM | POA: Diagnosis not present

## 2024-02-27 DIAGNOSIS — M6281 Muscle weakness (generalized): Secondary | ICD-10-CM

## 2024-02-27 NOTE — Therapy (Signed)
 OUTPATIENT PHYSICAL THERAPY BACK AND LE TREATMENT   Patient Name: Morgan Hawkins MRN: [id number] DOB:17-Dec-1985, 38 y.o., female Today's Date: 02/27/2024  END OF SESSION:  PT End of Session - 02/27/24 1353     Visit Number 4    Number of Visits 25    Date for PT Re-Evaluation 04/30/24    Progress Note Due on Visit 10    PT Start Time 1318    PT Stop Time 1400    PT Time Calculation (min) 42 min    Activity Tolerance Patient tolerated treatment well    Behavior During Therapy [facility] for tasks assessed/performed               Past Medical History:  Diagnosis Date   Asthma    Past Surgical History:  Procedure Laterality Date   CESAREAN SECTION  2014   Patient Active Problem List   Diagnosis Date Noted   Gallstone pancreatitis 10/10/2020    [facility]P: [facility]  REFERRING PROVIDER:  Oswaldo Conroy, MD    REFERRING DIAG:  Diagnosis  M25.561 (ICD-10-CM) - Pain in right knee  M25.571 (ICD-10-CM) - Pain in right ankle and joints of right foot    Rationale for Evaluation and Treatment: Rehabilitation  THERAPY DIAG:  Other low back pain  Muscle weakness (generalized)  ONSET DATE: December 31st, 2024   SUBJECTIVE:                                                                                                                                                                                           SUBJECTIVE STATEMENT: Pt reports things have not gotten worsen/things are same. Pt rates her pain as moderate-severe but more moderate. She reports same activities bring on her pain.   Pt reports pain has gotten worse since she started doing her HEP/exercises, and she is experiencing pain in R low back now. She reports it was painful to get onto knees to pick things up/complete ADLs. Pt reports no pain during the exercises but notices increased pain with daily activities. Pt reports pain into knee and foot also worsening.  Pt reports  TrP in low back identified last time is what is hurting during the day. Feels a jerk if on side in R low back, didn't used to feel this.  PERTINENT HISTORY:  Per chart hx of L breast mass, gallstone pancreatitis  PAIN:  Are you having pain?  Worst pain: 6-8/10, if still can be 0/10  Tries to move her leg to improve pain.  PRECAUTIONS:  None  RED FLAGS: None   WEIGHT BEARING RESTRICTIONS:  No  FALLS:  Has patient fallen in last 6 months? Yes. Number of falls 1  LIVING ENVIRONMENT: Lives with: lives with their spouse and two kids Lives in: House/apartment Stairs:  3, hand rails both sides. Holds on initially     OCCUPATION:  Works from home, sitting most of the time  PLOF:  Independent  PATIENT GOALS:  Get rid of the pain  OBJECTIVE:  Note: Objective measures were completed at Evaluation unless otherwise noted.  DIAGNOSTIC FINDINGS:  No pertinent imaging in chart, pt reports she has not had imaging taken of her LE  PATIENT SURVEYS:  LEFS 66/80  COGNITIVE STATUS: Within functional limits for tasks assessed   SENSATION: Majority of testing LE WNL, exception lateral RLE reports slight less sensation to light-touch   EDEMA:  No  POSTURE:  Weight shift to LLE in standing   GAIT: Distance walked: clinic distances ( ) Assistive device utilized: None Level of assistance: Complete Independence Comments: see below for details   Functional Tests: : 0.98 m/s; pt reports feeling increased weightbearing through LLE, observed elevation of R shoulder, decreased rotation, increased bilat pronation, bilat hip drop observed    Body Part #1 Knee    Hamstring length: Lacking 5-10 degree full range bilaterally, and painful at end-range on R with pulling sensation, does feel this on L but not as intense    PALPATION: RLE Not pain with palpation lateral, medial, anterior or posterior   No pain with R patellar mobs inferior, superior, medial or lateral,  normal quality of movement   LOWER EXTREMITY MMT:    LLE: strength grossly 4+/5 majority deficits found with proximal mm  RLE: strength grossly 4+/5 majority of deficits found with proximal mm *R Hip flexion is pain limited 4/5 where pain is felt wrapping around medial-lat aspect of R knee to R posterior knee   SPECIAL TESTS:  RLE: Lower Extremity Knee special tests: Anterior drawer test: negative, Posterior drawer test: negative, and Thessaly test: negative bilat but reports feeling unstable feeling standing on RLE     Body Part #2 Ankle   LOWER EXTREMITY ROM:     Pain with movement into valgus only, felt in last two digits of foot, pulling sensation, all other R ankle motions are pain-free   Lower Extremity Ankle special tests: Anterior drawer test: negative    Body Part #3: thoracolumbar  ROM: screen completed/observation, formal measurement not taken Flexion - observed WNL, no pain Extension - observed WNL, no pain Lateral flexion - observed WNL, no pain Rotation - possible increase in ROM bilat, pain felt in back of R knee with rotation to R side, no pain with rotation to L side   Repeated Motions: 10 reps of the following Seated: flexion - does recreate pain felt bottom R foot Standing flex/extension - no pain  Other: Pt with pain in RLE with seated FABER stretch position  TREATMENT DATE: 02/27/24  TE- To improve strength, endurance, mobility, and function of specific targeted muscle groups or improve joint range of motion or improve muscle flexibility  Seated stability ball rollout FWD/BCKWD 10x pt reports feels good Seated stability ball rollout LTL 10x - feels in R side low back Seated march 10x -each LE - feels sharp pain in R low back only when performed on RLE, ache on LLE, did feel it was shaky  Seated hip abduction 3x10 with  BTB  Mini squats 10x, 5x - feel some shaking in RLE but overall rates easy   On mat table: Reduced ROM SLR 2x10 each LE - no pain LTRs 2x10 each side   Bridge 2x10 Adductor squeeze with pball in hooklye 2x15 with 3 sec hold/rep  TrA activation and deadbug progression - hold one LE in slight hip flex 10x with 3 sec hold each LE - easy  --progressed to 5 reps bilat LE lift in 90:90 position (hip flex) and 3 sec hold/rep Faber stretch (modified for comfortable positioning RLE to prevent numbness in R foot) x60 sec each side Open book 2x10 each side Seated hamstring stretch long sitting x30 sec bilat LE - reports did not really feel on R side  Seated in chair hamstring stretch RLE only 20 sec  Added to HEP: Access Code: [email or web address] URL: [email or web address] Date: 02/27/2024 Prepared by: Temple Pacini  Exercises - Seated Hip Abduction with Resistance  - 1 x daily - 4-5 x weekly - 3 sets - 10 reps   PATIENT EDUCATION:  Education details: exercise technique, plan, HEP update Person educated: Patient Education method: Explanation, Demonstration, Verbal cues, and Handouts Education comprehension: verbalized understanding and returned demonstration  HOME EXERCISE PROGRAM: Added: Access Code: [email or web address] URL: [email or web address] Date: 02/27/2024 Prepared by: Temple Pacini  Exercises - Seated Hip Abduction with Resistance  - 1 x daily - 4-5 x weekly - 3 sets - 10 reps   Access Code: [email or web address] URL: [email or web address] Date: 02/14/2024 Prepared by: Thresa Ross  Exercises - Supine Lower Trunk Rotation  - 1 x daily - 5-6 x weekly - 1 sets - 10 reps - 5 sec hold - Supine Transversus Abdominis Bracing - Hands on Stomach  - 1 x daily - 5-6 x weekly - 2 sets - 10 reps - 3 sec hold - Supine march with TrA bracing   - 1 x daily - 5-6 x weekly - 2 sets - 10 reps - Clamshell  - 1 x daily - 5-6 x weekly - 3-4 sets - 10 reps - Seated Sciatic  Tensioner  - 1 x daily - 5-6 x weekly - 2 sets - 10 reps   ASSESSMENT:  CLINICAL IMPRESSION: Pt reports no pain-flare since last visit but pain has remained the same. Pt observed to tolerate seated hip abd/ER well in session without increased pain and added this to HEP. PT instructed pt to monitor symptoms and report back if she experiences another pain flare-up. Pt will continue to benefit from skilled physical therapy intervention to address impairments, improve QOL, and attain therapy goals.     OBJECTIVE IMPAIRMENTS: Abnormal gait, decreased activity tolerance, decreased mobility, difficulty walking, decreased ROM, decreased strength, impaired flexibility, impaired sensation, improper body mechanics, postural dysfunction, and pain.   ACTIVITY LIMITATIONS: lifting, sitting, standing, squatting, stairs, transfers, dressing, and locomotion level  PARTICIPATION LIMITATIONS: meal prep, cleaning, shopping, community activity, and yard work  PERSONAL FACTORS: Sex and Time since onset of injury/illness/exacerbation are also affecting patient's functional outcome.  REHAB POTENTIAL: Good  CLINICAL DECISION MAKING: Stable/uncomplicated  EVALUATION COMPLEXITY: Moderate   GOALS: Goals reviewed with patient? Yes   SHORT TERM GOALS: Target date: 03/19/2024    Patient will be independent in home exercise program to improve strength/mobility for better functional independence with ADLs. Baseline: initiated  Goal status: INITIAL   LONG TERM GOALS: Target date: 04/30/2024  Patient will increase lower extremity functional scale to >60/80 to demonstrate improved functional mobility and increased tolerance with ADLs.  Baseline: 66 Goal status: INITIAL  2.  Patient (> 35 years old) will complete five times sit to stand test in < 15 seconds indicating an increased LE strength and improved balance. Baseline: deferred 3/13: 13.02 sec  Goal status: INITIAL  3.  The pt will report a worst R  knee pain of no greater than 3/10 within the past two weeks to indicate improved QOL Baseline: 6-8/10 Goal status: INITIAL  4.  Patient will increase 10 meter walk test to >1.60m/s as to improve gait speed for better community ambulation and to reduce fall risk. Baseline:  0.98 m/s Goal status: INITIAL     PLAN:  PT FREQUENCY: 1-2x/week  PT DURATION: 12 weeks  PLANNED INTERVENTIONS: 97164- PT Re-evaluation, 97110-Therapeutic exercises, 97530- Therapeutic activity, 97112- Neuromuscular re-education, 97535- Self Care, 82956- Manual therapy, [phone]- Gait training, [phone]- Orthotic Fit/training, Patient/Family education, Balance training, Stair training, Taping, Dry Needling, Joint mobilization, Spinal mobilization, DME instructions, Cryotherapy, and Moist heat.  PLAN FOR NEXT SESSION: strengthening, add to HEP, assess if lumbar spine interventions are working.  Continuing to suspect L4/L5 nerve root compression.  If interventions are ineffective recommend imaging study to rule out other pathologies.   Baird Kay, PT 02/27/2024, 4:09 PM

## 2024-03-03 ENCOUNTER — Encounter: Admitting: Physical Therapy

## 2024-03-06 NOTE — Therapy (Signed)
 OUTPATIENT PHYSICAL THERAPY BACK AND LE TREATMENT   Patient Name: Morgan Hawkins MRN: [id number] DOB:[birth year], 38 y.o., female Today's Date: 03/10/2024  END OF SESSION:  PT End of Session - 03/10/24 1305     Visit Number 5    Number of Visits 25    Date for PT Re-Evaluation 04/30/24    Progress Note Due on Visit 10    PT Start Time 1314    PT Stop Time 1359    PT Time Calculation (min) 45 min    Activity Tolerance Patient tolerated treatment well    Behavior During Therapy [facility] for tasks assessed/performed                Past Medical History:  Diagnosis Date   Asthma    Past Surgical History:  Procedure Laterality Date   CESAREAN SECTION  2014   Patient Active Problem List   Diagnosis Date Noted   Gallstone pancreatitis 10/10/2020    PCP: [facility]  REFERRING PROVIDER:  Oswaldo Conroy, MD    REFERRING DIAG:  Diagnosis  M25.561 (ICD-10-CM) - Pain in right knee  M25.571 (ICD-10-CM) - Pain in right ankle and joints of right foot    Rationale for Evaluation and Treatment: Rehabilitation  THERAPY DIAG:  Other low back pain  Muscle weakness (generalized)  Chronic pain of right knee  Pain in right foot  ONSET DATE: December 31st, 2024   SUBJECTIVE:                                                                                                                                                                                           SUBJECTIVE STATEMENT Patient reports no changes in the tingling of her leg.    Pt reports pain has gotten worse since she started doing her HEP/exercises, and she is experiencing pain in R low back now. She reports it was painful to get onto knees to pick things up/complete ADLs. Pt reports no pain during the exercises but notices increased pain with daily activities. Pt reports pain into knee and foot also worsening.  Pt reports TrP in low back identified last time is what is  hurting during the day. Feels a jerk if on side in R low back, didn't used to feel this.  PERTINENT HISTORY:  Per chart hx of L breast mass, gallstone pancreatitis The pt is a pleasant 38 y/o female presenting to PT for R knee, ankle and foot pain. Pt not entirely sure of MOI, but does report fall onto her back/hitting her back December 31st. She did not feel any pain immediately following  the fall, but says she woke up the next day with RLE pain. Pain is triggered by movement following prolonged stillness/rest and takes minutes to improve. This has not improved at all since onset. The pain causes her to limp. She feels it primarily from the front of her knee with it wrapping around to the back. Trying to put a sock on her R foot causes her R toes to hurt, describes it as a nerve-like pain or a pulling sensation that travels from the inside of her knee to toes 3-5. She feels a numbness in her RLE that extends from her knee to her foot if she is in one position for too long. She can have some burning sensation. Due to the pain she sometimes has to grab onto things when up/moving. She reports no hx of imaging s/p fall.  PAIN:  Are you having pain?  Worst pain: 6-8/10, if still can be 0/10  Tries to move her leg to improve pain.  PRECAUTIONS:  None  RED FLAGS: None   WEIGHT BEARING RESTRICTIONS:  No  FALLS:  Has patient fallen in last 6 months? Yes. Number of falls 1  LIVING ENVIRONMENT: Lives with: lives with their spouse and two kids Lives in: House/apartment Stairs:  3, hand rails both sides. Holds on initially     OCCUPATION:  Works from home, sitting most of the time  PLOF:  Independent  PATIENT GOALS:  Get rid of the pain  OBJECTIVE:  Note: Objective measures were completed at Evaluation unless otherwise noted.  DIAGNOSTIC FINDINGS:  No pertinent imaging in chart, pt reports she has not had imaging taken of her LE  PATIENT SURVEYS:  LEFS 66/80  COGNITIVE STATUS: Within  functional limits for tasks assessed   SENSATION: Majority of testing LE WNL, exception lateral RLE reports slight less sensation to light-touch   EDEMA:  No  POSTURE:  Weight shift to LLE in standing   GAIT: Distance walked: clinic distances ( ) Assistive device utilized: None Level of assistance: Complete Independence Comments: see below for details   Functional Tests: : 0.98 m/s; pt reports feeling increased weightbearing through LLE, observed elevation of R shoulder, decreased rotation, increased bilat pronation, bilat hip drop observed    Body Part #1 Knee    Hamstring length: Lacking 5-10 degree full range bilaterally, and painful at end-range on R with pulling sensation, does feel this on L but not as intense    PALPATION: RLE Not pain with palpation lateral, medial, anterior or posterior   No pain with R patellar mobs inferior, superior, medial or lateral, normal quality of movement   LOWER EXTREMITY MMT:    LLE: strength grossly 4+/5 majority deficits found with proximal mm  RLE: strength grossly 4+/5 majority of deficits found with proximal mm *R Hip flexion is pain limited 4/5 where pain is felt wrapping around medial-lat aspect of R knee to R posterior knee   SPECIAL TESTS:  RLE: Lower Extremity Knee special tests: Anterior drawer test: negative, Posterior drawer test: negative, and Thessaly test: negative bilat but reports feeling unstable feeling standing on RLE     Body Part #2 Ankle   LOWER EXTREMITY ROM:     Pain with movement into valgus only, felt in last two digits of foot, pulling sensation, all other R ankle motions are pain-free   Lower Extremity Ankle special tests: Anterior drawer test: negative    Body Part #3: thoracolumbar  ROM: screen completed/observation, formal measurement not taken Flexion -  observed WNL, no pain Extension - observed WNL, no pain Lateral flexion - observed WNL, no pain Rotation - possible  increase in ROM bilat, pain felt in back of R knee with rotation to R side, no pain with rotation to L side   Repeated Motions: 10 reps of the following Seated: flexion - does recreate pain felt bottom R foot Standing flex/extension - no pain  Other: Pt with pain in RLE with seated FABER stretch position                                                                                                                            TREATMENT DATE: 03/10/24  TE- To improve strength, endurance, mobility, and function of specific targeted muscle groups or improve joint range of motion or improve muscle flexibility Prone press up 2x30 seconds  Supine: TrA activation with swiss ball 15x 3 second TrA deadbug with swiss ball 10x each side Hamstring curl with green swiss ball  Posterior pelvic tilt with adduction squeeze 10x Bridge 10x; arms crossed  Education on self mobilization with PVC pipe for pain reduction   Hamstring stretch 30 seconds each LE Sciatic nerve glide RLE 20x Modified figure four stretch 60 seconds RLE  Manual: AP talocrural mob R ankle x multiple reps Lumbar mobilization grade II x multiple reps  R innominate posterior rotation in sidelying x 6 minutes  Trigger Point Dry Needling  Initial Treatment: Pt instructed on Dry Needling rational, procedures, and possible side effects. Pt instructed to expect mild to moderate muscle soreness later in the day and/or into the next day.  Pt instructed in methods to reduce muscle soreness. Pt instructed to continue prescribed HEP. Patient was educated on signs and symptoms of infection and other risk factors and advised to seek medical attention should they occur.  Patient verbalized understanding of these instructions and education.   Patient Verbal Consent Given: Yes Education Handout Provided: Yes Muscles Treated: R piriformis and R lumbar paraspinal  Electrical Stimulation Performed: No Treatment Response/Outcome: multiple  trigger points released     PATIENT EDUCATION:  Education details: exercise technique, plan, HEP update Person educated: Patient Education method: Explanation, Demonstration, Verbal cues, and Handouts Education comprehension: verbalized understanding and returned demonstration  Trigger Point Dry Needling  What is Trigger Point Dry Needling (DN)? DN is a physical therapy technique used to treat muscle pain and dysfunction. Specifically, DN helps deactivate muscle trigger points (muscle knots).  A thin filiform needle is used to penetrate the skin and stimulate the underlying trigger point. The goal is for a local twitch response (LTR) to occur and for the trigger point to relax. No medication of any kind is injected during the procedure.   What Does Trigger Point Dry Needling Feel Like?  The procedure feels different for each individual patient. Some patients report that they do not actually feel the needle enter the skin and overall the process is not painful. Very mild bleeding may occur.  However, many patients feel a deep cramping in the muscle in which the needle was inserted. This is the local twitch response.   How Will I feel after the treatment? Soreness is normal, and the onset of soreness may not occur for a few hours. Typically this soreness does not last longer than two days.  Bruising is uncommon, however; ice can be used to decrease any possible bruising.  In rare cases feeling tired or nauseous after the treatment is normal. In addition, your symptoms may get worse before they get better, this period will typically not last longer than 24 hours.   What Can I do After My Treatment? Increase your hydration by drinking more water for the next 24 hours.  You may place ice or heat on the areas treated that have become sore, however, do not use heat on inflamed or bruised areas. Heat often brings more relief post needling. You can continue your regular activities, but vigorous  activity is not recommended initially after the treatment for 24 hours. DN is best combined with other physical therapy such as strengthening, stretching, and other therapies.   What are the complications? While your therapist has had extensive training in minimizing the risks of trigger point dry needling, it is important to understand the risks of any procedure.  Risks include bleeding, pain, fatigue, hematoma, infection, vertigo, nausea or nerve involvement. Monitor for any changes to your skin or sensation. Contact your therapist or MD with concerns.  A rare but serious complication is a pneumothorax over or near your middle and upper chest and back If you have dry needling in this area, monitor for the following symptoms: Shortness of breath on exertion and/or Difficulty taking a deep breath and/or Chest Pain and/or A dry cough If any of the above symptoms develop, please go to the nearest emergency room or call 911. Tell them you had dry needling over your thorax and report any symptoms you are having. Please follow-up with your treating therapist after you complete the medical evaluation.   HOME EXERCISE PROGRAM: Added: Access Code: [email or web address] URL: [email or web address] Date: 02/27/2024 Prepared by: Temple Pacini  Exercises - Seated Hip Abduction with Resistance  - 1 x daily - 4-5 x weekly - 3 sets - 10 reps   Access Code: [email or web address] URL: [email or web address] Date: 02/14/2024 Prepared by: Thresa Ross  Exercises - Supine Lower Trunk Rotation  - 1 x daily - 5-6 x weekly - 1 sets - 10 reps - 5 sec hold - Supine Transversus Abdominis Bracing - Hands on Stomach  - 1 x daily - 5-6 x weekly - 2 sets - 10 reps - 3 sec hold - Supine march with TrA bracing   - 1 x daily - 5-6 x weekly - 2 sets - 10 reps - Clamshell  - 1 x daily - 5-6 x weekly - 3-4 sets - 10 reps - Seated Sciatic Tensioner  - 1 x daily - 5-6 x weekly - 2 sets - 10  reps   ASSESSMENT:  CLINICAL IMPRESSION: Innominate rotation of R pelvis performed with patient reporting decreased pain/discomfort. Education on home technique tolerated well with patient demonstrating understanding. Patient introduced to TDN with large trigger point of piriformis and glute. Patient educated on importance of core stabilization and strength and verbalized understanding.  Pt will continue to benefit from skilled physical therapy intervention to address impairments, improve QOL, and attain therapy goals.     OBJECTIVE IMPAIRMENTS: Abnormal gait, decreased activity tolerance, decreased mobility, difficulty walking,  decreased ROM, decreased strength, impaired flexibility, impaired sensation, improper body mechanics, postural dysfunction, and pain.   ACTIVITY LIMITATIONS: lifting, sitting, standing, squatting, stairs, transfers, dressing, and locomotion level  PARTICIPATION LIMITATIONS: meal prep, cleaning, shopping, community activity, and yard work  PERSONAL FACTORS: Sex and Time since onset of injury/illness/exacerbation are also affecting patient's functional outcome.   REHAB POTENTIAL: Good  CLINICAL DECISION MAKING: Stable/uncomplicated  EVALUATION COMPLEXITY: Moderate   GOALS: Goals reviewed with patient? Yes   SHORT TERM GOALS: Target date: 03/19/2024    Patient will be independent in home exercise program to improve strength/mobility for better functional independence with ADLs. Baseline: initiated  Goal status: INITIAL   LONG TERM GOALS: Target date: 04/30/2024  Patient will increase lower extremity functional scale to >60/80 to demonstrate improved functional mobility and increased tolerance with ADLs.  Baseline: 66 Goal status: INITIAL  2.  Patient (> 27 years old) will complete five times sit to stand test in < 15 seconds indicating an increased LE strength and improved balance. Baseline: deferred 3/13: 13.02 sec  Goal status: INITIAL  3.  The pt  will report a worst R knee pain of no greater than 3/10 within the past two weeks to indicate improved QOL Baseline: 6-8/10 Goal status: INITIAL  4.  Patient will increase 10 meter walk test to >1.32m/s as to improve gait speed for better community ambulation and to reduce fall risk. Baseline:  0.98 m/s Goal status: INITIAL     PLAN:  PT FREQUENCY: 1-2x/week  PT DURATION: 12 weeks  PLANNED INTERVENTIONS: 97164- PT Re-evaluation, 97110-Therapeutic exercises, 97530- Therapeutic activity, 97112- Neuromuscular re-education, 97535- Self Care, 16109- Manual therapy, [phone]- Gait training, [phone]- Orthotic Fit/training, Patient/Family education, Balance training, Stair training, Taping, Dry Needling, Joint mobilization, Spinal mobilization, DME instructions, Cryotherapy, and Moist heat.  PLAN FOR NEXT SESSION: strengthening, add to HEP, assess if lumbar spine interventions are working.  Continuing to suspect L4/L5 nerve root compression.  If interventions are ineffective recommend imaging study to rule out other pathologies.   Precious Bard, PT 03/10/2024, 2:20 PM

## 2024-03-10 ENCOUNTER — Encounter

## 2024-03-10 ENCOUNTER — Ambulatory Visit: Attending: Family Medicine

## 2024-03-10 DIAGNOSIS — M5459 Other low back pain: Secondary | ICD-10-CM | POA: Diagnosis present

## 2024-03-10 DIAGNOSIS — G8929 Other chronic pain: Secondary | ICD-10-CM | POA: Diagnosis present

## 2024-03-10 DIAGNOSIS — M79671 Pain in right foot: Secondary | ICD-10-CM | POA: Insufficient documentation

## 2024-03-10 DIAGNOSIS — M6281 Muscle weakness (generalized): Secondary | ICD-10-CM | POA: Diagnosis present

## 2024-03-10 DIAGNOSIS — M25561 Pain in right knee: Secondary | ICD-10-CM | POA: Diagnosis present

## 2024-03-24 ENCOUNTER — Ambulatory Visit: Admitting: Physical Therapy

## 2024-03-24 ENCOUNTER — Ambulatory Visit

## 2024-03-24 DIAGNOSIS — M6281 Muscle weakness (generalized): Secondary | ICD-10-CM

## 2024-03-24 DIAGNOSIS — M5459 Other low back pain: Secondary | ICD-10-CM

## 2024-03-24 DIAGNOSIS — G8929 Other chronic pain: Secondary | ICD-10-CM

## 2024-03-24 DIAGNOSIS — M79671 Pain in right foot: Secondary | ICD-10-CM

## 2024-03-24 NOTE — Therapy (Signed)
 OUTPATIENT PHYSICAL THERAPY BACK AND LE TREATMENT   Patient Name: Morgan Hawkins MRN: [id number] DOB:23-Aug-1986, 38 y.o., female Today's Date: 03/25/2024  END OF SESSION:  PT End of Session - 03/24/24 1314     Visit Number 6    Number of Visits 25    Date for PT Re-Evaluation 04/30/24    Progress Note Due on Visit 10    PT Start Time 1317    PT Stop Time 1358    PT Time Calculation (min) 41 min    Activity Tolerance Patient tolerated treatment well    Behavior During Therapy [facility] for tasks assessed/performed                Past Medical History:  Diagnosis Date   Asthma    Past Surgical History:  Procedure Laterality Date   CESAREAN SECTION  2014   Patient Active Problem List   Diagnosis Date Noted   Gallstone pancreatitis 10/10/2020    PCP: [facility]  REFERRING PROVIDER:  Dionicia Frater, MD    REFERRING DIAG:  Diagnosis  M25.561 (ICD-10-CM) - Pain in right knee  M25.571 (ICD-10-CM) - Pain in right ankle and joints of right foot    Rationale for Evaluation and Treatment: Rehabilitation  THERAPY DIAG:  Other low back pain  Chronic pain of right knee  Pain in right foot  Muscle weakness (generalized)  ONSET DATE: December 31st, 2024   SUBJECTIVE:                                                                                                                                                                                           SUBJECTIVE STATEMENT Pt reports dry needling helped for short term, but pain soon came back. Pt reports still does not have pain at rest, but reports pain with activities that goes to a 6-8/10.  Has not experienced overall improvement since starting PT. Pt still feels numbness as well.  PERTINENT HISTORY:  Per chart hx of L breast mass, gallstone pancreatitis The pt is a pleasant 38 y/o female presenting to PT for R knee, ankle and foot pain. Pt not entirely sure of MOI, but does  report fall onto her back/hitting her back December 31st. She did not feel any pain immediately following the fall, but says she woke up the next day with RLE pain. Pain is triggered by movement following prolonged stillness/rest and takes minutes to improve. This has not improved at all since onset. The pain causes her to limp. She feels it primarily from the front of her knee with it wrapping around to the back.  Trying to put a sock on her R foot causes her R toes to hurt, describes it as a nerve-like pain or a pulling sensation that travels from the inside of her knee to toes 3-5. She feels a numbness in her RLE that extends from her knee to her foot if she is in one position for too long. She can have some burning sensation. Due to the pain she sometimes has to grab onto things when up/moving. She reports no hx of imaging s/p fall.  PAIN:  Are you having pain?  Worst pain: 6-8/10, if still can be 0/10  Tries to move her leg to improve pain.  PRECAUTIONS:  None  RED FLAGS: None   WEIGHT BEARING RESTRICTIONS:  No  FALLS:  Has patient fallen in last 6 months? Yes. Number of falls 1  LIVING ENVIRONMENT: Lives with: lives with their spouse and two kids Lives in: House/apartment Stairs:  3, hand rails both sides. Holds on initially     OCCUPATION:  Works from home, sitting most of the time  PLOF:  Independent  PATIENT GOALS:  Get rid of the pain  OBJECTIVE:  Note: Objective measures were completed at Evaluation unless otherwise noted.  DIAGNOSTIC FINDINGS:  No pertinent imaging in chart, pt reports she has not had imaging taken of her LE  PATIENT SURVEYS:  LEFS 66/80  COGNITIVE STATUS: Within functional limits for tasks assessed   SENSATION: Majority of testing LE WNL, exception lateral RLE reports slight less sensation to light-touch   EDEMA:  No  POSTURE:  Weight shift to LLE in standing   GAIT: Distance walked: clinic distances ( ) Assistive device utilized:  None Level of assistance: Complete Independence Comments: see below for details   Functional Tests: : 0.98 m/s; pt reports feeling increased weightbearing through LLE, observed elevation of R shoulder, decreased rotation, increased bilat pronation, bilat hip drop observed    Body Part #1 Knee    Hamstring length: Lacking 5-10 degree full range bilaterally, and painful at end-range on R with pulling sensation, does feel this on L but not as intense    PALPATION: RLE Not pain with palpation lateral, medial, anterior or posterior   No pain with R patellar mobs inferior, superior, medial or lateral, normal quality of movement   LOWER EXTREMITY MMT:    LLE: strength grossly 4+/5 majority deficits found with proximal mm  RLE: strength grossly 4+/5 majority of deficits found with proximal mm *R Hip flexion is pain limited 4/5 where pain is felt wrapping around medial-lat aspect of R knee to R posterior knee   SPECIAL TESTS:  RLE: Lower Extremity Knee special tests: Anterior drawer test: negative, Posterior drawer test: negative, and Thessaly test: negative bilat but reports feeling unstable feeling standing on RLE     Body Part #2 Ankle   LOWER EXTREMITY ROM:     Pain with movement into valgus only, felt in last two digits of foot, pulling sensation, all other R ankle motions are pain-free   Lower Extremity Ankle special tests: Anterior drawer test: negative    Body Part #3: thoracolumbar  ROM: screen completed/observation, formal measurement not taken Flexion - observed WNL, no pain Extension - observed WNL, no pain Lateral flexion - observed WNL, no pain Rotation - possible increase in ROM bilat, pain felt in back of R knee with rotation to R side, no pain with rotation to L side   Repeated Motions: 10 reps of the following Seated: flexion - does recreate  pain felt bottom R foot Standing flex/extension - no pain  Other: Pt with pain in RLE with seated  FABER stretch position                                                                                                                            TREATMENT DATE: 03/25/24  Physical Performance: 10 Meter Walk Test: Patient instructed to walk 10 meters (32.8 ft) as quickly and as safely as possible at their normal speed. Time measured from 2 meter mark to 8 meter mark to accommodate ramp-up and ramp-down.  Normal speed: 1.14 m/s  Cut off scores: <0.4 m/s = household Ambulator, 0.4-0.8 m/s = limited community Ambulator, >0.8 m/s = community Ambulator, >1.2 m/s = crossing a street, <1.0 = increased fall risk MCID 0.05 m/s (small), 0.13 m/s (moderate) (ANPTA Core Set of Outcome Measures for Adults with Neurologic Conditions, 2018)  Five times Sit to Stand Test (FTSS)  TIME: 11.1 sec  Cut off scores indicative of increased fall risk: >12 sec CVA, >16 sec PD, >13 sec vestibular (ANPTA Core Set of Outcome Measures for Adults with Neurologic Conditions, 2018)  TE: Please refer to goals section below for full details/results of other assessments (LEFs, pain, HEP question)    TE- To improve strength, endurance, mobility, and function of specific targeted muscle groups or improve joint range of motion or improve muscle flexibility  On mat table: Knee-to-chest: 2x10 sec each side - initial pain with first set on R  Glute bridge 2x15 Supine sciatic nerve glide RLE 10x - feels some pain into the knee and foot  Swiss ball hamstring curl with TrA activation 10x  Prone press up 10x Cat-camel 2x10  Bird-dog alt UE only with TrA activation 2x10  TrA activation with PPT 1x0  TrA deadbug with alt LE march only  --addition of alt UE with LE 10x - easy  TrA activation deadbug 90:90 position 3x15 sec Clamshells 2x10 each side   Seated: Hamstring stretch 30 seconds each LE Modified figure four stretch 30 sec each LE Seated twist/t-spine rotation 5x each direction  Seated spine ext/flex 5x each way     PATIENT EDUCATION:  Education details: exercise technique,  Person educated: Patient Education method: Explanation, Demonstration, and Verbal cues Education comprehension: verbalized understanding and returned demonstration  .   HOME EXERCISE PROGRAM: Added: Access Code: [email or web address] URL: [email or web address] Date: 02/27/2024 Prepared by: Aminta Kales  Exercises - Seated Hip Abduction with Resistance  - 1 x daily - 4-5 x weekly - 3 sets - 10 reps   Access Code: [email or web address] URL: [email or web address] Date: 02/14/2024 Prepared by: Marlynn Singer  Exercises - Supine Lower Trunk Rotation  - 1 x daily - 5-6 x weekly - 1 sets - 10 reps - 5 sec hold - Supine Transversus Abdominis Bracing - Hands on Stomach  - 1 x daily - 5-6 x weekly - 2 sets - 10 reps - 3 sec hold - Supine march with  TrA bracing   - 1 x daily - 5-6 x weekly - 2 sets - 10 reps - Clamshell  - 1 x daily - 5-6 x weekly - 3-4 sets - 10 reps - Seated Sciatic Tensioner  - 1 x daily - 5-6 x weekly - 2 sets - 10 reps   ASSESSMENT:  CLINICAL IMPRESSION: Pt returns reporting she only experienced short-term pain relief in pain following last visit. Otherwise, pt has not experienced improvement or overall change in pain symptoms/they've remained the same. PT recommends holding further physical therapy for now until pt can follow up with PCP with PT recommendation for imaging for low back. While pt has not made progress with pain, or reported numbness, she has shown functional improvements per goal reassessment completed (see above).   Pt will continue to benefit from skilled physical therapy intervention to address impairments, improve QOL, and attain therapy goals.     OBJECTIVE IMPAIRMENTS: Abnormal gait, decreased activity tolerance, decreased mobility, difficulty walking, decreased ROM, decreased strength, impaired flexibility, impaired sensation, improper body mechanics, postural dysfunction, and  pain.   ACTIVITY LIMITATIONS: lifting, sitting, standing, squatting, stairs, transfers, dressing, and locomotion level  PARTICIPATION LIMITATIONS: meal prep, cleaning, shopping, community activity, and yard work  PERSONAL FACTORS: Sex and Time since onset of injury/illness/exacerbation are also affecting patient's functional outcome.   REHAB POTENTIAL: Good  CLINICAL DECISION MAKING: Stable/uncomplicated  EVALUATION COMPLEXITY: Moderate   GOALS: Goals reviewed with patient? Yes   SHORT TERM GOALS: Target date: 03/19/2024    Patient will be independent in home exercise program to improve strength/mobility for better functional independence with ADLs. Baseline: initiated  Goal status: IN PROGRESS   LONG TERM GOALS: Target date: 04/30/2024  Patient will increase lower extremity functional scale to >60/80 to demonstrate improved functional mobility and increased tolerance with ADLs.  Baseline: 66; 4/21: 76 Goal status: MET  2.  Patient (> 73 years old) will complete five times sit to stand test in < 15 seconds indicating an increased LE strength and improved balance. Baseline: deferred 3/13: 13.02 sec ; 03/24/24: 11.1 sec hands-free  Goal status: MET  3.  The pt will report a worst R knee pain of no greater than 3/10 within the past two weeks to indicate improved QOL Baseline: 6-8/10; 03/24/24: still reaches 6-8/10 Goal status: IN PROGRESS  4.  Patient will increase 10 meter walk test to >1.2m/s as to improve gait speed for better community ambulation and to reduce fall risk. Baseline:  0.98 m/s; 03/24/24: 1.14 m/s (8.8 sec) Goal status: MET     PLAN:  PT FREQUENCY: 1-2x/week  PT DURATION: 12 weeks  PLANNED INTERVENTIONS: 97164- PT Re-evaluation, 97110-Therapeutic exercises, 97530- Therapeutic activity, 97112- Neuromuscular re-education, 97535- Self Care, 16109- Manual therapy, [phone]- Gait training, [phone]- Orthotic Fit/training, Patient/Family education, Balance  training, Stair training, Taping, Dry Needling, Joint mobilization, Spinal mobilization, DME instructions, Cryotherapy, and Moist heat.  PLAN FOR NEXT SESSION: strengthening, add to HEP, assess if lumbar spine interventions are working.  Continuing to suspect L4/L5 nerve root compression.  If interventions are ineffective recommend imaging study to rule out other pathologies.   Samie Crews, PT 03/25/2024, 8:23 AM

## 2024-03-25 ENCOUNTER — Telehealth: Payer: Self-pay

## 2024-03-25 NOTE — Telephone Encounter (Signed)
 PT contacted pt's PCP clinic with updates regarding pt's PT POC. PT also provided recommendation for low back imaging due to no change in pt's pain symptoms since starting PT.   Aminta Kales PT, DPT

## 2024-03-26 ENCOUNTER — Other Ambulatory Visit: Payer: Self-pay | Admitting: Primary Care

## 2024-03-26 ENCOUNTER — Ambulatory Visit
Admission: RE | Admit: 2024-03-26 | Discharge: 2024-03-26 | Disposition: A | Discharge status: Home / Self Care | Source: Ambulatory Visit | Attending: Primary Care | Admitting: Primary Care

## 2024-03-26 DIAGNOSIS — M25561 Pain in right knee: Secondary | ICD-10-CM | POA: Diagnosis present

## 2024-03-26 DIAGNOSIS — M25571 Pain in right ankle and joints of right foot: Secondary | ICD-10-CM

## 2024-03-31 ENCOUNTER — Ambulatory Visit: Admitting: Physical Therapy

## 2024-04-02 ENCOUNTER — Encounter: Admitting: Physical Therapy

## 2024-04-02 ENCOUNTER — Ambulatory Visit: Admitting: Physical Therapy

## 2024-04-07 ENCOUNTER — Encounter: Admitting: Physical Therapy

## 2024-04-07 ENCOUNTER — Ambulatory Visit: Admitting: Physical Therapy

## 2024-04-14 ENCOUNTER — Ambulatory Visit: Admitting: Physical Therapy

## 2024-04-14 ENCOUNTER — Encounter: Admitting: Physical Therapy

## 2024-04-21 ENCOUNTER — Ambulatory Visit

## 2024-04-30 ENCOUNTER — Encounter: Admitting: Physical Therapy

## 2024-05-05 ENCOUNTER — Encounter: Admitting: Physical Therapy

## 2024-05-05 ENCOUNTER — Ambulatory Visit: Admitting: Physical Therapy

## 2024-05-12 ENCOUNTER — Ambulatory Visit: Admitting: Physical Therapy

## 2024-05-12 ENCOUNTER — Encounter: Admitting: Physical Therapy

## 2024-05-19 ENCOUNTER — Encounter

## 2024-05-19 ENCOUNTER — Encounter: Admitting: Physical Therapy

## 2024-05-26 ENCOUNTER — Encounter: Admitting: Physical Therapy

## 2024-06-02 ENCOUNTER — Encounter: Admitting: Physical Therapy

## 2024-06-09 ENCOUNTER — Encounter: Admitting: Physical Therapy

## 2024-06-16 ENCOUNTER — Encounter: Admitting: Physical Therapy

## 2024-06-23 ENCOUNTER — Encounter: Admitting: Physical Therapy
